# Patient Record
Sex: Female | Born: 1975 | Race: Black or African American | Hispanic: No | Marital: Married | Smoking: Never smoker
Health system: Southern US, Community
[De-identification: ages and names within clinical notes are randomized; demographics above are authoritative.]

## PROBLEM LIST (undated history)

## (undated) DIAGNOSIS — Z9289 Personal history of other medical treatment: Secondary | ICD-10-CM

## (undated) DIAGNOSIS — C50919 Malignant neoplasm of unspecified site of unspecified female breast: Secondary | ICD-10-CM

## (undated) DIAGNOSIS — I8390 Asymptomatic varicose veins of unspecified lower extremity: Secondary | ICD-10-CM

## (undated) DIAGNOSIS — E039 Hypothyroidism, unspecified: Secondary | ICD-10-CM

## (undated) HISTORY — PX: OTHER SURGICAL HISTORY: SHX169

## (undated) HISTORY — PX: VEIN LIGATION AND STRIPPING: SHX2653

## (undated) HISTORY — DX: Personal history of other medical treatment: Z92.89

---

## 2001-05-31 ENCOUNTER — Inpatient Hospital Stay (HOSPITAL_COMMUNITY): Admission: AD | Admit: 2001-05-31 | Discharge: 2001-06-02 | Payer: Self-pay | Admitting: Obstetrics

## 2002-03-30 ENCOUNTER — Encounter: Admission: RE | Admit: 2002-03-30 | Discharge: 2002-03-30 | Payer: Self-pay | Admitting: Neurology

## 2002-06-02 ENCOUNTER — Inpatient Hospital Stay (HOSPITAL_COMMUNITY): Admission: EM | Admit: 2002-06-02 | Discharge: 2002-06-04 | Payer: Self-pay | Admitting: Emergency Medicine

## 2002-06-03 ENCOUNTER — Encounter (INDEPENDENT_AMBULATORY_CARE_PROVIDER_SITE_OTHER): Payer: Self-pay | Admitting: Cardiovascular Disease

## 2002-06-04 ENCOUNTER — Encounter: Payer: Self-pay | Admitting: Cardiovascular Disease

## 2002-06-08 ENCOUNTER — Encounter: Payer: Self-pay | Admitting: Endocrinology

## 2002-06-08 ENCOUNTER — Ambulatory Visit (HOSPITAL_COMMUNITY): Admission: RE | Admit: 2002-06-08 | Discharge: 2002-06-08 | Payer: Self-pay | Admitting: Endocrinology

## 2004-09-26 ENCOUNTER — Encounter: Admission: RE | Admit: 2004-09-26 | Discharge: 2004-11-21 | Payer: Self-pay | Admitting: Orthopedic Surgery

## 2004-11-26 ENCOUNTER — Emergency Department (HOSPITAL_COMMUNITY): Admission: EM | Admit: 2004-11-26 | Discharge: 2004-11-26 | Payer: Self-pay | Admitting: Family Medicine

## 2004-11-28 ENCOUNTER — Emergency Department (HOSPITAL_COMMUNITY): Admission: EM | Admit: 2004-11-28 | Discharge: 2004-11-28 | Payer: Self-pay | Admitting: Family Medicine

## 2006-02-03 ENCOUNTER — Emergency Department (HOSPITAL_COMMUNITY): Admission: EM | Admit: 2006-02-03 | Discharge: 2006-02-03 | Payer: Self-pay | Admitting: Family Medicine

## 2007-06-22 ENCOUNTER — Emergency Department (HOSPITAL_COMMUNITY): Admission: EM | Admit: 2007-06-22 | Discharge: 2007-06-22 | Payer: Self-pay | Admitting: Family Medicine

## 2007-10-10 ENCOUNTER — Emergency Department (HOSPITAL_COMMUNITY): Admission: EM | Admit: 2007-10-10 | Discharge: 2007-10-10 | Payer: Self-pay | Admitting: Family Medicine

## 2008-03-09 ENCOUNTER — Emergency Department (HOSPITAL_COMMUNITY): Admission: EM | Admit: 2008-03-09 | Discharge: 2008-03-09 | Payer: Self-pay | Admitting: Emergency Medicine

## 2008-07-27 ENCOUNTER — Emergency Department (HOSPITAL_COMMUNITY): Admission: EM | Admit: 2008-07-27 | Discharge: 2008-07-27 | Payer: Self-pay | Admitting: Emergency Medicine

## 2010-09-23 ENCOUNTER — Encounter: Payer: Self-pay | Admitting: Orthopedic Surgery

## 2011-01-18 NOTE — Consult Note (Signed)
[name], [name]                            ACCOUNT NO.:  [id number]   MEDICAL RECORD NO.:  [id number]                   PATIENT TYPE:  INP   LOCATION:  3711                                 FACILITY:  [facility]   PHYSICIAN:  Reather Littler, M.D.                    DATE OF BIRTH:  May 14, 1976   DATE OF CONSULTATION:  DATE OF DISCHARGE:                                   CONSULTATION   CHIEF COMPLAINT:  Rapid heart rate.   HISTORY OF PRESENT ILLNESS:  The patient is a 35 year old African-American  patient who presented to the emergency room with rapid heart rate which he  says for about two months he has been noticing intermittent palpitations  with rapid heart rate which he did not seek attention for.  During the  working hours today the patient experienced rather fast heart rate and was  apparently checked at work and found that her heart rate was extremely high.  She was referred to the emergency room and admitted.  The patient also says  that she feels somewhat shaky in her legs and a little bit weaker although  she didn't have any specific fatigue.  She usually does not have any  shakiness of her hands  but she does feel anxious sometimes.  She does not  complain of feeling  excessively hot or sweaty.  She, however, has lost at  least 15 pounds in the last two months.  She thought she was losing weight  intentionally but her weight came off quite rapidly.  The patient also has  had some frequent and somewhat loose bowel movements over this period of  time.  The patient had noticed a little prominence of her legs at this time  also.   Currently the patient does not have any primary care physician and is  admitted under the cardiology service because of sinus tachycardia.   PAST MEDICAL HISTORY:  Not significant.   PERSONAL HISTORY:  The patient works as a Nature conservation officer.   REVIEW OF SYMPTOMS:  No significant illnesses or other conditions.   ALLERGIES:  None.   PHYSICAL  EXAMINATION:  GENERAL:  Patient is adequately built and nourished.  She is alert and cooperative.  She is not unusually restless or  hyperkinetic.  VITAL SIGNS:  Her heart rate is 106, blood pressure normal.  Temperature  normal.  She does not appear to be diaphoretic.  Her hands are not  excessively warm.  She has a mild fine tremor present.  HEENT:  Eyes are not excessively prominent.  There is mild lid lag present  but no stare.  NECK: Patient has a large thyroid, about 2.5 to 3X normal size which has a  mild tremor and bruit.  It is felt to be soft, diffuse without tenderness.  HEART:  Sounds are normal.  LUNGS:  Clear.  ABDOMEN:  No mass.  EXTREMITIES:  She has no edema or skin lesions.  She has extremely brisk  reflexes.    ASSESSMENT:  Patient probably has hyperthyroidism and will need evaluation  and treatment for probable Grave's disease.  Meanwhile she will probably  need high doses of beta blockers to facilitate heart rate control.  We will  schedule an I-131 uptake before the weekend.                                               Reather Littler, M.D.    AK/MEDQ  D:  06/03/2002  T:  06/07/2002  Job:  [id number]

## 2011-05-24 LAB — POCT URINALYSIS DIP (DEVICE)
Bilirubin Urine: NEGATIVE
Glucose, UA: NEGATIVE
Hgb urine dipstick: NEGATIVE
Ketones, ur: NEGATIVE
Nitrite: NEGATIVE
Operator id: 247071
Protein, ur: NEGATIVE
Specific Gravity, Urine: 1.02
Urobilinogen, UA: 0.2
pH: 5.5

## 2011-05-24 LAB — POCT PREGNANCY, URINE
Operator id: 247071
Preg Test, Ur: NEGATIVE

## 2011-05-30 LAB — POCT I-STAT, CHEM 8
BUN: 10
Calcium, Ion: 1.23
Chloride: 104
Creatinine, Ser: 1.2
Glucose, Bld: 93
HCT: 41
Hemoglobin: 13.9
Potassium: 3.8
Sodium: 140
TCO2: 25

## 2011-05-30 LAB — POCT CARDIAC MARKERS
CKMB, poc: 2.7
Myoglobin, poc: 51.2
Operator id: 294591
Troponin i, poc: <0.05

## 2011-05-30 LAB — TSH: TSH: 35.431 — ABNORMAL HIGH (ref 0.350–4.500)

## 2011-06-12 LAB — POCT PREGNANCY, URINE
Operator id: 235561
Preg Test, Ur: NEGATIVE

## 2011-12-22 ENCOUNTER — Encounter (HOSPITAL_COMMUNITY): Payer: Self-pay

## 2011-12-22 ENCOUNTER — Inpatient Hospital Stay (HOSPITAL_COMMUNITY)
Admission: EM | Admit: 2011-12-22 | Discharge: 2011-12-24 | DRG: 392 | Disposition: A | Discharge status: Home / Self Care | Payer: 59 | Attending: Internal Medicine | Admitting: Internal Medicine

## 2011-12-22 ENCOUNTER — Emergency Department (HOSPITAL_COMMUNITY): Payer: 59

## 2011-12-22 DIAGNOSIS — K625 Hemorrhage of anus and rectum: Secondary | ICD-10-CM | POA: Diagnosis present

## 2011-12-22 DIAGNOSIS — Z79899 Other long term (current) drug therapy: Secondary | ICD-10-CM

## 2011-12-22 DIAGNOSIS — K529 Noninfective gastroenteritis and colitis, unspecified: Secondary | ICD-10-CM | POA: Diagnosis present

## 2011-12-22 DIAGNOSIS — E669 Obesity, unspecified: Secondary | ICD-10-CM | POA: Diagnosis present

## 2011-12-22 DIAGNOSIS — E039 Hypothyroidism, unspecified: Secondary | ICD-10-CM | POA: Diagnosis present

## 2011-12-22 DIAGNOSIS — I8390 Asymptomatic varicose veins of unspecified lower extremity: Secondary | ICD-10-CM | POA: Diagnosis present

## 2011-12-22 DIAGNOSIS — R1030 Lower abdominal pain, unspecified: Secondary | ICD-10-CM | POA: Diagnosis present

## 2011-12-22 DIAGNOSIS — K5289 Other specified noninfective gastroenteritis and colitis: Principal | ICD-10-CM | POA: Diagnosis present

## 2011-12-22 DIAGNOSIS — Z6833 Body mass index (BMI) 33.0-33.9, adult: Secondary | ICD-10-CM

## 2011-12-22 DIAGNOSIS — K5900 Constipation, unspecified: Secondary | ICD-10-CM | POA: Diagnosis present

## 2011-12-22 HISTORY — DX: Asymptomatic varicose veins of unspecified lower extremity: I83.90

## 2011-12-22 HISTORY — DX: Hypothyroidism, unspecified: E03.9

## 2011-12-22 LAB — CBC
HCT: 38.7 % (ref 36.0–46.0)
Hemoglobin: 13.2 g/dL (ref 12.0–15.0)
MCH: 30.4 pg (ref 26.0–34.0)
MCHC: 34.1 g/dL (ref 30.0–36.0)
MCV: 89.2 fL (ref 78.0–100.0)
Platelets: 265 10*3/uL (ref 150–400)
RBC: 4.34 MIL/uL (ref 3.87–5.11)
RDW: 13.1 % (ref 11.5–15.5)
WBC: 10.3 10*3/uL (ref 4.0–10.5)

## 2011-12-22 LAB — DIFFERENTIAL
Basophils Absolute: 0 10*3/uL (ref 0.0–0.1)
Basophils Relative: 0 % (ref 0–1)
Eosinophils Absolute: 0 10*3/uL (ref 0.0–0.7)
Eosinophils Relative: 0 % (ref 0–5)
Lymphocytes Relative: 31 % (ref 12–46)
Lymphs Abs: 3.2 10*3/uL (ref 0.7–4.0)
Monocytes Absolute: 0.6 10*3/uL (ref 0.1–1.0)
Monocytes Relative: 6 % (ref 3–12)
Neutro Abs: 6.5 10*3/uL (ref 1.7–7.7)
Neutrophils Relative %: 63 % (ref 43–77)

## 2011-12-22 LAB — COMPREHENSIVE METABOLIC PANEL
ALT: 13 U/L (ref 0–35)
AST: 21 U/L (ref 0–37)
Albumin: 4.3 g/dL (ref 3.5–5.2)
Alkaline Phosphatase: 55 U/L (ref 39–117)
BUN: 5 mg/dL — ABNORMAL LOW (ref 6–23)
CO2: 25 mEq/L (ref 19–32)
Calcium: 10.2 mg/dL (ref 8.4–10.5)
Chloride: 103 mEq/L (ref 96–112)
Creatinine, Ser: 1.08 mg/dL (ref 0.50–1.10)
GFR calc Af Amer: 76 mL/min — ABNORMAL LOW (ref >90)
GFR calc non Af Amer: 65 mL/min — ABNORMAL LOW (ref >90)
Glucose, Bld: 107 mg/dL — ABNORMAL HIGH (ref 70–99)
Potassium: 3.6 mEq/L (ref 3.5–5.1)
Sodium: 137 mEq/L (ref 135–145)
Total Bilirubin: 0.4 mg/dL (ref 0.3–1.2)
Total Protein: 8.1 g/dL (ref 6.0–8.3)

## 2011-12-22 LAB — PROTIME-INR
INR: 1 (ref 0.00–1.49)
Prothrombin Time: 13.4 seconds (ref 11.6–15.2)

## 2011-12-22 LAB — HEMOGLOBIN AND HEMATOCRIT, BLOOD
HCT: 36.4 % (ref 36.0–46.0)
Hemoglobin: 12.4 g/dL (ref 12.0–15.0)

## 2011-12-22 LAB — SAMPLE TO BLOOD BANK

## 2011-12-22 LAB — APTT: aPTT: 31 seconds (ref 24–37)

## 2011-12-22 MED ORDER — METRONIDAZOLE IN NACL 5-0.79 MG/ML-% IV SOLN
500.0000 mg | Freq: Once | INTRAVENOUS | Status: AC
  Administered 2011-12-22: 500 mg via INTRAVENOUS
  Filled 2011-12-22: qty 100

## 2011-12-22 MED ORDER — SODIUM CHLORIDE 0.9 % IV SOLN
Freq: Once | INTRAVENOUS | Status: AC
  Administered 2011-12-22: 20:00:00 via INTRAVENOUS

## 2011-12-22 MED ORDER — ONDANSETRON HCL 4 MG/2ML IJ SOLN
4.0000 mg | Freq: Four times a day (QID) | INTRAMUSCULAR | Status: DC | PRN
  Administered 2011-12-23: 4 mg via INTRAVENOUS
  Filled 2011-12-22: qty 2

## 2011-12-22 MED ORDER — SODIUM CHLORIDE 0.9 % IV SOLN
INTRAVENOUS | Status: DC
  Administered 2011-12-22 – 2011-12-23 (×2): via INTRAVENOUS
  Administered 2011-12-23: 100 mL/h via INTRAVENOUS

## 2011-12-22 MED ORDER — SODIUM CHLORIDE 0.9 % IJ SOLN
3.0000 mL | Freq: Two times a day (BID) | INTRAMUSCULAR | Status: DC
  Administered 2011-12-24: 3 mL via INTRAVENOUS

## 2011-12-22 MED ORDER — ACETAMINOPHEN 650 MG RE SUPP: 650.0000 mg | Freq: Four times a day (QID) | RECTAL | Status: DC | PRN

## 2011-12-22 MED ORDER — IOHEXOL 300 MG/ML  SOLN: 100.0000 mL | Freq: Once | INTRAMUSCULAR | Status: AC | PRN

## 2011-12-22 MED ORDER — ZOLPIDEM TARTRATE 5 MG PO TABS: 5.0000 mg | ORAL_TABLET | Freq: Every evening | ORAL | Status: DC | PRN

## 2011-12-22 MED ORDER — ACETAMINOPHEN 325 MG PO TABS: 650.0000 mg | ORAL_TABLET | Freq: Four times a day (QID) | ORAL | Status: DC | PRN

## 2011-12-22 MED ORDER — PANTOPRAZOLE SODIUM 40 MG IV SOLR
40.0000 mg | Freq: Two times a day (BID) | INTRAVENOUS | Status: DC
  Administered 2011-12-22 – 2011-12-24 (×4): 40 mg via INTRAVENOUS
  Filled 2011-12-22 (×7): qty 40

## 2011-12-22 MED ORDER — HYDROMORPHONE HCL PF 1 MG/ML IJ SOLN
0.5000 mg | INTRAMUSCULAR | Status: DC | PRN
  Administered 2011-12-23: 1 mg via INTRAVENOUS
  Filled 2011-12-22: qty 1

## 2011-12-22 MED ORDER — CIPROFLOXACIN IN D5W 400 MG/200ML IV SOLN
400.0000 mg | Freq: Once | INTRAVENOUS | Status: AC
  Administered 2011-12-22: 400 mg via INTRAVENOUS
  Filled 2011-12-22: qty 200

## 2011-12-22 MED ORDER — OXYCODONE HCL 5 MG PO TABS: 5.0000 mg | ORAL_TABLET | ORAL | Status: DC | PRN

## 2011-12-22 MED ORDER — ONDANSETRON HCL 4 MG PO TABS: 4.0000 mg | ORAL_TABLET | Freq: Four times a day (QID) | ORAL | Status: DC | PRN

## 2011-12-22 NOTE — ED Notes (Signed)
RN to obtain labs when placing IV

## 2011-12-22 NOTE — ED Notes (Signed)
Pt in from home with c/o rectal bleeding states onset last night after taking stool softener states stool was bright red

## 2011-12-22 NOTE — ED Notes (Signed)
Patient transported to CT 

## 2011-12-22 NOTE — ED Provider Notes (Signed)
History     CSN: [id number]  Arrival date & time 12/22/11  1759   First MD Initiated Contact with Patient 12/22/11 1935      Chief Complaint  Patient presents with  . Rectal Bleeding    (Consider location/radiation/quality/duration/timing/severity/associated sxs/prior treatment) Patient is a 36 y.o. female presenting with hematochezia. The history is provided by the patient.  Rectal Bleeding    patient here with bright red blood per rectum x2 days. Does note some lower abdominal cramping. Has had constipation recently with hard stools and symptoms started after she tried to move her bowels. No vomiting or fever. No prior history of inflammatory bowel disease or rectal bleeding or colon masses according to the patient. No medications used prior to arrival. Symptoms worse with defecation and better with nothing  Past Medical History  Diagnosis Date  . Thyroid disease     History reviewed. No pertinent past surgical history.  No family history on file.  History  Substance Use Topics  . Smoking status: Never Smoker   . Smokeless tobacco: Not on file  . Alcohol Use: No    OB History    Grav Para Term Preterm Abortions TAB SAB Ect Mult Living                  Review of Systems  Gastrointestinal: Positive for hematochezia.  All other systems reviewed and are negative.    Allergies  Vicodin  Home Medications   Current Outpatient Rx  Name Route Sig Dispense Refill  . LEVOTHYROXINE SODIUM 200 MCG PO TABS Oral Take 200 mcg by mouth daily.      BP 153/87  Pulse 92  Temp(Src) 99 F (37.2 C) (Oral)  Resp 16  SpO2 100%  LMP 12/10/2011  Physical Exam  Nursing note and vitals reviewed. Constitutional: She is oriented to person, place, and time. She appears well-developed and well-nourished.  Non-toxic appearance. No distress.  HENT:  Head: Normocephalic and atraumatic.  Eyes: Conjunctivae, EOM and lids are normal. Pupils are equal, round, and reactive to light.    Neck: Normal range of motion. Neck supple. No tracheal deviation present. No mass present.  Cardiovascular: Normal rate, regular rhythm and normal heart sounds.  Exam reveals no gallop.   No murmur heard. Pulmonary/Chest: Effort normal and breath sounds normal. No stridor. No respiratory distress. She has no decreased breath sounds. She has no wheezes. She has no rhonchi. She has no rales.  Abdominal: Soft. Normal appearance and bowel sounds are normal. She exhibits no distension. There is no tenderness. There is no rebound and no CVA tenderness.  Genitourinary:       Pink changed mucus noted on rectal exam, no external hemorrhoids  Musculoskeletal: Normal range of motion. She exhibits no edema and no tenderness.  Neurological: She is alert and oriented to person, place, and time. She has normal strength. No cranial nerve deficit or sensory deficit. GCS eye subscore is 4. GCS verbal subscore is 5. GCS motor subscore is 6.  Skin: Skin is warm and dry. No abrasion and no rash noted.  Psychiatric: She has a normal mood and affect. Her speech is normal and behavior is normal.    ED Course  Procedures (including critical care time)   Labs Reviewed  CBC  DIFFERENTIAL  COMPREHENSIVE METABOLIC PANEL  PROTIME-INR  APTT  SAMPLE TO BLOOD BANK   No results found.   No diagnosis found.    MDM  Pt with colitis as seen on abd ct,  abx started, will be admitted to triad        Toy Baker, MD 12/27/11 [phone]

## 2011-12-22 NOTE — ED Notes (Signed)
MD at bedside. 

## 2011-12-22 NOTE — ED Notes (Signed)
CT notified pt completed CM 

## 2011-12-23 ENCOUNTER — Encounter (HOSPITAL_COMMUNITY): Payer: Self-pay | Admitting: Internal Medicine

## 2011-12-23 DIAGNOSIS — K529 Noninfective gastroenteritis and colitis, unspecified: Secondary | ICD-10-CM | POA: Diagnosis present

## 2011-12-23 DIAGNOSIS — E039 Hypothyroidism, unspecified: Secondary | ICD-10-CM | POA: Insufficient documentation

## 2011-12-23 DIAGNOSIS — R1030 Lower abdominal pain, unspecified: Secondary | ICD-10-CM | POA: Diagnosis present

## 2011-12-23 DIAGNOSIS — K625 Hemorrhage of anus and rectum: Secondary | ICD-10-CM | POA: Diagnosis present

## 2011-12-23 DIAGNOSIS — I8390 Asymptomatic varicose veins of unspecified lower extremity: Secondary | ICD-10-CM | POA: Insufficient documentation

## 2011-12-23 LAB — BASIC METABOLIC PANEL
BUN: 5 mg/dL — ABNORMAL LOW (ref 6–23)
CO2: 25 mEq/L (ref 19–32)
Calcium: 9 mg/dL (ref 8.4–10.5)
Chloride: 107 mEq/L (ref 96–112)
Creatinine, Ser: 1.03 mg/dL (ref 0.50–1.10)
GFR calc Af Amer: 80 mL/min — ABNORMAL LOW (ref >90)
GFR calc non Af Amer: 69 mL/min — ABNORMAL LOW (ref >90)
Glucose, Bld: 107 mg/dL — ABNORMAL HIGH (ref 70–99)
Potassium: 3.7 mEq/L (ref 3.5–5.1)
Sodium: 139 mEq/L (ref 135–145)

## 2011-12-23 LAB — CBC
HCT: 35.1 % — ABNORMAL LOW (ref 36.0–46.0)
Hemoglobin: 11.7 g/dL — ABNORMAL LOW (ref 12.0–15.0)
MCH: 29.7 pg (ref 26.0–34.0)
MCHC: 33.3 g/dL (ref 30.0–36.0)
MCV: 89.1 fL (ref 78.0–100.0)
Platelets: 214 10*3/uL (ref 150–400)
RBC: 3.94 MIL/uL (ref 3.87–5.11)
RDW: 13.1 % (ref 11.5–15.5)
WBC: 8.4 10*3/uL (ref 4.0–10.5)

## 2011-12-23 LAB — TYPE AND SCREEN
ABO/RH(D): A POS
Antibody Screen: NEGATIVE

## 2011-12-23 LAB — ABO/RH: ABO/RH(D): A POS

## 2011-12-23 MED ORDER — ALUM & MAG HYDROXIDE-SIMETH 200-200-20 MG/5ML PO SUSP
30.0000 mL | Freq: Four times a day (QID) | ORAL | Status: DC | PRN
  Administered 2011-12-23: 30 mL via ORAL
  Filled 2011-12-23: qty 30

## 2011-12-23 MED ORDER — METRONIDAZOLE IN NACL 5-0.79 MG/ML-% IV SOLN
500.0000 mg | Freq: Three times a day (TID) | INTRAVENOUS | Status: DC
  Administered 2011-12-23 – 2011-12-24 (×3): 500 mg via INTRAVENOUS
  Filled 2011-12-23 (×7): qty 100

## 2011-12-23 MED ORDER — CIPROFLOXACIN IN D5W 400 MG/200ML IV SOLN
400.0000 mg | INTRAVENOUS | Status: DC
  Administered 2011-12-23: 400 mg via INTRAVENOUS
  Filled 2011-12-23 (×2): qty 200

## 2011-12-23 MED ORDER — SODIUM CHLORIDE 0.9 % IV SOLN
INTRAVENOUS | Status: AC
  Administered 2011-12-23: 02:00:00 via INTRAVENOUS

## 2011-12-23 MED ORDER — HYDROMORPHONE HCL PF 1 MG/ML IJ SOLN: 0.5000 mg | INTRAMUSCULAR | Status: DC | PRN

## 2011-12-23 NOTE — Progress Notes (Signed)
Subjective: Ambulating in room. Gait steady. No further BRBPR since 1am. NAD.  Objective: Vital signs Filed Vitals:   12/22/11 1811 12/23/11 0054 12/23/11 0115 12/23/11 0516  BP: 153/87 114/58 104/60 105/63  Pulse: 92 63 64 63  Temp: 99 F (37.2 C) 98.5 F (36.9 C) 98.7 F (37.1 C) 98 F (36.7 C)  TempSrc: Oral Oral Oral Oral  Resp: 16 18 18 19   Height:    5\' 4"  (1.626 m)  Weight:    88.633 kg (195 lb 6.4 oz)  SpO2: 100% 99% 97% 99%   Weight change:  Last BM Date: 12/22/11 (Pt reports prior bm's were bloody w/bright red blood)  Intake/Output from previous day: 04/21 0701 - 04/22 0700 In: 600 [I.V.:600] Out: 250 [Urine:250] Total I/O In: 240 [P.O.:240] Out: 400 [Urine:400]   Physical Exam: General: Alert, awake, oriented x3, in no acute distress. Well nourished, very pleasant HEENT: No bruits, no goiter. Mucus membranes mouth moist/pink. PERRL Heart: Regular rate and rhythm, without murmurs, rubs, gallops. No LEE Lungs:Normal effort. Breath sounds clear to auscultation bilaterally. No wheeze, rhonchi Abdomen: Obese,  Soft, nontender, nondistended, positive bowel sounds but very sluggish. Extremities: No clubbing cyanosis or edema with positive pedal pulses. Neuro: Grossly intact, nonfocal. Cranial nerve II-XII intact.     Lab Results: Basic Metabolic Panel:  Basename 12/23/11 0410 12/22/11 1948  NA 139 137  K 3.7 3.6  CL 107 103  CO2 25 25  GLUCOSE 107* 107*  BUN 5* 5*  CREATININE 1.03 1.08  CALCIUM 9.0 10.2  MG -- --  PHOS -- --   Liver Function Tests:  [facility] 12/22/11 1948  AST 21  ALT 13  ALKPHOS 55  BILITOT 0.4  PROT 8.1  ALBUMIN 4.3   No results found for this basename: LIPASE:2,AMYLASE:2 in the last 72 hours No results found for this basename: AMMONIA:2 in the last 72 hours CBC:  Basename 12/23/11 0410 12/22/11 2250 12/22/11 1948  WBC 8.4 -- 10.3  NEUTROABS -- -- 6.5  HGB 11.7* 12.4 --  HCT 35.1* 36.4 --  MCV 89.1 -- 89.2  PLT 214  -- 265   Cardiac Enzymes: No results found for this basename: CKTOTAL:3,CKMB:3,CKMBINDEX:3,TROPONINI:3 in the last 72 hours BNP: No results found for this basename: PROBNP:3 in the last 72 hours D-Dimer: No results found for this basename: DDIMER:2 in the last 72 hours CBG: No results found for this basename: GLUCAP:6 in the last 72 hours Hemoglobin A1C: No results found for this basename: HGBA1C in the last 72 hours Fasting Lipid Panel: No results found for this basename: CHOL,HDL,LDLCALC,TRIG,CHOLHDL,LDLDIRECT in the last 72 hours Thyroid Function Tests: No results found for this basename: TSH,T4TOTAL,FREET4,T3FREE,THYROIDAB in the last 72 hours Anemia Panel: No results found for this basename: VITAMINB12,FOLATE,FERRITIN,TIBC,IRON,RETICCTPCT in the last 72 hours Coagulation:  Basename 12/22/11 1948  LABPROT 13.4  INR 1.00   Urine Drug Screen: Drugs of Abuse  No results found for this basename: labopia, cocainscrnur, labbenz, amphetmu, thcu, labbarb    Alcohol Level: No results found for this basename: ETH:2 in the last 72 hours Urinalysis: No results found for this basename: COLORURINE:2,APPERANCEUR:2,LABSPEC:2,PHURINE:2,GLUCOSEU:2,HGBUR:2,BILIRUBINUR:2,KETONESUR:2,PROTEINUR:2,UROBILINOGEN:2,NITRITE:2,LEUKOCYTESUR:2 in the last 72 hours Misc. Labs:  No results found for this or any previous visit (from the past 240 hour(s)).  Studies/Results: Ct Abdomen Pelvis W Contrast  12/22/2011  *RADIOLOGY REPORT*  Clinical Data: Bright red blood per rectum for 2 days.  Lower abdominal cramping and constipation.  CT ABDOMEN AND PELVIS WITH CONTRAST  Technique:  Multidetector CT imaging of  the abdomen and pelvis was performed following the standard protocol during bolus administration of intravenous contrast.  Contrast:  100 ml Omnipaque-300  Comparison: None.  Findings: Focal atelectasis or infiltration in the left lung base medially.  Small oral contrast collection in the abdominal  hiatus which could represent a small hiatal hernia or a lower esophageal diverticulum.  The liver, spleen, gallbladder, pancreas, adrenal glands, kidneys, abdominal aorta, and retroperitoneal lymph nodes are unremarkable. The gastric wall is not thickened.  Small bowel are not distended. No obvious wall thickening.  No free air or free fluid in the abdomen.  Pelvis:  The distal transverse colon, descending colon, and rectosigmoid colon are decompressed.  There is suggestion of diffuse mural edema.  Infectious colitis or inflammatory bowel disease should be excluded although changes may just be due to under distension.  Stool filled ascending colon without wall thickening.  The uterus and adnexal structures are not enlarged.  Minimal free fluid in the pelvis, likely physiologic.  Bladder wall is not thickened.  No loculated pelvic fluid collections.  No significant pelvic lymphadenopathy.  Normal alignment of the lumbar vertebrae.  IMPRESSION: Diffuse wall thickening and edema is suggested in the left colon, although changes may be due to under distension.  Colitis should be excluded.  No evidence of obstruction.  Small esophageal hiatal hernia versus lower esophageal diverticulum.  Minimal free fluid in the pelvis.  Original Report Authenticated By: Marlon Pel, M.D.    Medications: Scheduled Meds:   . sodium chloride   Intravenous Once  . sodium chloride   Intravenous STAT  . ciprofloxacin  400 mg Intravenous Once  . metronidazole  500 mg Intravenous Once  . pantoprazole (PROTONIX) IV  40 mg Intravenous Q12H  . sodium chloride  3 mL Intravenous Q12H   Continuous Infusions:   . sodium chloride 100 mL/hr (12/23/11 0812)   PRN Meds:.acetaminophen, acetaminophen, HYDROmorphone, iohexol, ondansetron (ZOFRAN) IV, ondansetron, oxyCODONE, zolpidem  Assessment/Plan:  Principal Problem:  *Rectal bleeding: No further episodes since 1am. Hg stable. Will discontinue serial H&H. Will check in am  and/or if has another episode BRBPR. VSS. Continue IV protonix. Will defer GI consult for now given improvement. Will need OP follow up. If reoccurrence will consult GI. Active Problems:  Abdominal pain, lower: much improved. Continue to manage with IV dilaudid. CT as above. No BM/flatus. Monitor. Continue protonix  Colitis, acute: no hx of same. CT abdomen as above. Requesting advancement of diet. Will advance diet. Have explained to pt. Need to rest bowel . Reports some appetite and want to try to advance. Will monitor.  Dispo: home when medically stable. Hopefully 24-48 hours.     LOS: 1 day   [facility] M 12/23/2011, 1:05 PM

## 2011-12-23 NOTE — H&P (Signed)
DATE OF ADMISSION:  12/23/2011  PCP:    Provider Not In System   Chief Complaint: Rectal Bleeding   HPI: [name] is an 36 y.o. female who presents to the Ed with complaints of rectal bleeding which started in the afternoon.  She reports seeing copious bright red blood in the toilet. She reports having constipation the day before, and taking 2 stool softeners the next Am.  And in the afternoon her symptoms started.   She denies any lightheadedness, weakness, fatigue SOB or Chest pain.  She also denies having nausea or vomiting or fever.   She does report having crampy lower abdominal pain.  She states that she has not had any similar symptoms in the past.  In the Ed she was found to have hematochezia, and was Heme+, her admission hemoglobin was 13.2, and she was hemodynamically stable.    Past Medical History  Diagnosis Date  . Hypothyroid   . Varicose veins     Past Surgical History  Procedure Date  . Vein ligation and stripping     Medications:  HOME MEDS: Prior to Admission medications   Medication Sig Start Date End Date Taking? Authorizing Provider  levothyroxine (SYNTHROID, LEVOTHROID) 200 MCG tablet Take 200 mcg by mouth daily.   Yes Historical Provider, MD    Allergies:  Allergies  Allergen Reactions  . Vicodin (Hydrocodone-Acetaminophen) Nausea And Vomiting    Social History:   reports that she has never smoked. She does not have any smokeless tobacco history on file. She reports that she does not drink alcohol or use illicit drugs.  Family History: Family History  Problem Relation Age of Onset  . Diabetes type I Father   . Diabetes type II Sister   . Hypertension Sister     Review of Systems: Positive for:  abnormal bleeding, abdominal pain, The patient denies anorexia, fever, weight loss, vision loss, decreased hearing, hoarseness, chest pain, syncope, dyspnea on exertion, peripheral edema, balance deficits, hemoptysis,  melena, hematochezia, severe  indigestion/heartburn, hematuria, incontinence, genital sores, muscle weakness, suspicious skin lesions, transient blindness, difficulty walking, depression, unusual weight change, , enlarged lymph nodes, angioedema, and breast masses.   Physical Exam:  GEN:  Pleasant 36 year old well nourished and well developed African American female examined  and in no acute distress; cooperative with exam Filed Vitals:   12/22/11 1811 12/23/11 0054  BP: 153/87 114/58  Pulse: 92 63  Temp: 99 F (37.2 C) 98.5 F (36.9 C)  TempSrc: Oral Oral  Resp: 16 18  SpO2: 100% 99%   Blood pressure 114/58, pulse 63, temperature 98.5 F (36.9 C), temperature source Oral, resp. rate 18, last menstrual period 12/10/2011, SpO2 99.00%. PSYCH: She is alert and oriented x4; does not appear anxious does not appear depressed; affect is normal HEENT: Normocephalic and Atraumatic, Mucous membranes pink; PERRLA; EOM intact; Fundi:  Benign;  No scleral icterus, Nares: Patent, Oropharynx: Clear, Fair Dentition, Neck:  FROM, no cervical lymphadenopathy nor thyromegaly or carotid bruit; no JVD; Breasts:: Not examined CHEST WALL: No tenderness CHEST: Normal respiration, clear to auscultation bilaterally HEART: Regular rate and rhythm; no murmurs rubs or gallops BACK: No kyphosis or scoliosis; no CVA tenderness ABDOMEN: Positive Bowel Sounds, Obese, soft non-tender; no masses, no organomegaly. Rectal Exam:  Performed by EDP EXTREMITIES: No cyanosis, clubbing or edema; no ulcerations. Genitalia: not examined PULSES: 2+ and symmetric SKIN: Normal hydration no rash or ulceration CNS: Cranial nerves 2-12 grossly intact no focal neurologic deficit  Labs & Imaging Results for orders placed during the hospital encounter of 12/22/11 (from the past 48 hour(s))  CBC     Status: Normal   Collection Time   12/22/11  7:48 PM      Component Value Range Comment   WBC 10.3  4.0 - 10.5 (K/uL)    RBC 4.34  3.87 - 5.11 (MIL/uL)     Hemoglobin 13.2  12.0 - 15.0 (g/dL)    HCT 19.1  47.8 - 29.5 (%)    MCV 89.2  78.0 - 100.0 (fL)    MCH 30.4  26.0 - 34.0 (pg)    MCHC 34.1  30.0 - 36.0 (g/dL)    RDW 62.1  30.8 - 65.7 (%)    Platelets 265  150 - 400 (K/uL)   DIFFERENTIAL     Status: Normal   Collection Time   12/22/11  7:48 PM      Component Value Range Comment   Neutrophils Relative 63  43 - 77 (%)    Neutro Abs 6.5  1.7 - 7.7 (K/uL)    Lymphocytes Relative 31  12 - 46 (%)    Lymphs Abs 3.2  0.7 - 4.0 (K/uL)    Monocytes Relative 6  3 - 12 (%)    Monocytes Absolute 0.6  0.1 - 1.0 (K/uL)    Eosinophils Relative 0  0 - 5 (%)    Eosinophils Absolute 0.0  0.0 - 0.7 (K/uL)    Basophils Relative 0  0 - 1 (%)    Basophils Absolute 0.0  0.0 - 0.1 (K/uL)   COMPREHENSIVE METABOLIC PANEL     Status: Abnormal   Collection Time   12/22/11  7:48 PM      Component Value Range Comment   Sodium 137  135 - 145 (mEq/L)    Potassium 3.6  3.5 - 5.1 (mEq/L)    Chloride 103  96 - 112 (mEq/L)    CO2 25  19 - 32 (mEq/L)    Glucose, Bld 107 (*) 70 - 99 (mg/dL)    BUN 5 (*) 6 - 23 (mg/dL)    Creatinine, Ser 8.46  0.50 - 1.10 (mg/dL)    Calcium 96.2  8.4 - 10.5 (mg/dL)    Total Protein 8.1  6.0 - 8.3 (g/dL)    Albumin 4.3  3.5 - 5.2 (g/dL)    AST 21  0 - 37 (U/L)    ALT 13  0 - 35 (U/L)    Alkaline Phosphatase 55  39 - 117 (U/L)    Total Bilirubin 0.4  0.3 - 1.2 (mg/dL)    GFR calc non Af Amer 65 (*) >90 (mL/min)    GFR calc Af Amer 76 (*) >90 (mL/min)   PROTIME-INR     Status: Normal   Collection Time   12/22/11  7:48 PM      Component Value Range Comment   Prothrombin Time 13.4  11.6 - 15.2 (seconds)    INR 1.00  0.00 - 1.49    APTT     Status: Normal   Collection Time   12/22/11  7:48 PM      Component Value Range Comment   aPTT 31  24 - 37 (seconds)   SAMPLE TO BLOOD BANK     Status: Normal   Collection Time   12/22/11  7:48 PM      Component Value Range Comment   Blood Bank Specimen SAMPLE AVAILABLE FOR TESTING  Sample Expiration 12/25/2011     HEMOGLOBIN AND HEMATOCRIT, BLOOD     Status: Normal   Collection Time   12/22/11 10:50 PM      Component Value Range Comment   Hemoglobin 12.4  12.0 - 15.0 (g/dL)    HCT 14.7  82.9 - 56.2 (%)   TYPE AND SCREEN     Status: Normal   Collection Time   12/22/11 10:50 PM      Component Value Range Comment   ABO/RH(D) A POS      Antibody Screen NEG      Sample Expiration 12/25/2011     ABO/RH     Status: Normal   Collection Time   12/22/11 10:50 PM      Component Value Range Comment   ABO/RH(D) A POS      Ct Abdomen Pelvis W Contrast  12/22/2011  *RADIOLOGY REPORT*  Clinical Data: Bright red blood per rectum for 2 days.  Lower abdominal cramping and constipation.  CT ABDOMEN AND PELVIS WITH CONTRAST  Technique:  Multidetector CT imaging of the abdomen and pelvis was performed following the standard protocol during bolus administration of intravenous contrast.  Contrast:  100 ml Omnipaque-300  Comparison: None.  Findings: Focal atelectasis or infiltration in the left lung base medially.  Small oral contrast collection in the abdominal hiatus which could represent a small hiatal hernia or a lower esophageal diverticulum.  The liver, spleen, gallbladder, pancreas, adrenal glands, kidneys, abdominal aorta, and retroperitoneal lymph nodes are unremarkable. The gastric wall is not thickened.  Small bowel are not distended. No obvious wall thickening.  No free air or free fluid in the abdomen.  Pelvis:  The distal transverse colon, descending colon, and rectosigmoid colon are decompressed.  There is suggestion of diffuse mural edema.  Infectious colitis or inflammatory bowel disease should be excluded although changes may just be due to under distension.  Stool filled ascending colon without wall thickening.  The uterus and adnexal structures are not enlarged.  Minimal free fluid in the pelvis, likely physiologic.  Bladder wall is not thickened.  No loculated pelvic fluid  collections.  No significant pelvic lymphadenopathy.  Normal alignment of the lumbar vertebrae.  IMPRESSION: Diffuse wall thickening and edema is suggested in the left colon, although changes may be due to under distension.  Colitis should be excluded.  No evidence of obstruction.  Small esophageal hiatal hernia versus lower esophageal diverticulum.  Minimal free fluid in the pelvis.  Original Report Authenticated By: Marlon Pel, M.D.      Assessment: Present on Admission:  .Rectal bleeding .Abdominal pain, lower .Colitis, acute    Plan:        Admit to Telemetry Bed IVFs and IV Protonix ordered IV Cipro and Metronidazole antibiotic therapy ordered for colitis Pain Control PRN Type and screen sent and H/H checks, transfuse if needed SCDs  Consider GI consultation.   Other plans as per orders.    CODE STATUS:      FULL CODE      Shalayah Beagley C 12/23/2011, 12:56 AM

## 2011-12-24 LAB — CBC
HCT: 36.9 % (ref 36.0–46.0)
Hemoglobin: 12.3 g/dL (ref 12.0–15.0)
MCH: 30.1 pg (ref 26.0–34.0)
MCHC: 33.3 g/dL (ref 30.0–36.0)
MCV: 90.2 fL (ref 78.0–100.0)
Platelets: 227 10*3/uL (ref 150–400)
RBC: 4.09 MIL/uL (ref 3.87–5.11)
RDW: 13.3 % (ref 11.5–15.5)
WBC: 7.7 10*3/uL (ref 4.0–10.5)

## 2011-12-24 MED ORDER — UNABLE TO FIND: Status: DC

## 2011-12-24 MED ORDER — METRONIDAZOLE 500 MG PO TABS: 500.0000 mg | ORAL_TABLET | Freq: Three times a day (TID) | ORAL | Status: AC

## 2011-12-24 MED ORDER — CIPROFLOXACIN HCL 500 MG PO TABS: 250.0000 mg | ORAL_TABLET | Freq: Two times a day (BID) | ORAL | Status: AC

## 2011-12-24 MED ORDER — OXYCODONE HCL 5 MG PO TABS: 5.0000 mg | ORAL_TABLET | ORAL | Status: AC | PRN

## 2011-12-24 NOTE — Discharge Summary (Signed)
Physician Discharge Summary  Patient ID: [name] MRN: [id number] DOB/AGE: 37-Mar-1977 37 y.o.  Admit date: 12/22/2011 Discharge date: 12/24/2011  Primary Care Physician:  Provider Not In System   Discharge Diagnoses:    Principal Problem:  *Rectal bleeding Active Problems:  Abdominal pain, lower  Colitis, acute   Medication List  As of 12/24/2011  1:49 PM   TAKE these medications         ciprofloxacin 500 MG tablet   Commonly known as: CIPRO   Take 0.5 tablets (250 mg total) by mouth 2 (two) times daily.      levothyroxine 200 MCG tablet   Commonly known as: SYNTHROID, LEVOTHROID   Take 200 mcg by mouth daily.      metroNIDAZOLE 500 MG tablet   Commonly known as: FLAGYL   Take 1 tablet (500 mg total) by mouth 3 (three) times daily.      oxyCODONE 5 MG immediate release tablet   Commonly known as: Oxy IR/ROXICODONE   Take 1 tablet (5 mg total) by mouth every 4 (four) hours as needed.      UNABLE TO FIND   Kimmberly may return to work 12/26/11 with no restrictions             Disposition and Follow-up: Pt is medically stable and ready for discharge. Will follow up with OB/Gyn as this is only provider she has and will request referral for GI OP follow up and/or PCP referral.   Consults:  None   Physical Exam: General: Alert, awake, oriented x3, in no acute distress. Well nourished, very pleasant  HEENT: No bruits, no goiter. Mucus membranes mouth moist/pink. PERRL  Heart: Regular rate and rhythm, without murmurs, rubs, gallops. No LEE  Lungs:Normal effort. Breath sounds clear to auscultation bilaterally. No wheeze, rhonchi  Abdomen: Obese, Soft, nontender, nondistended, positive bowel sounds .  Extremities: No clubbing cyanosis or edema with positive pedal pulses.  Neuro: Grossly intact, nonfocal. Cranial nerve II-XII intact    Significant Diagnostic Studies:  Ct Abdomen Pelvis W Contrast  12/22/2011  *RADIOLOGY REPORT*  Clinical Data: Bright red blood per  rectum for 2 days.  Lower abdominal cramping and constipation.  CT ABDOMEN AND PELVIS WITH CONTRAST  Technique:  Multidetector CT imaging of the abdomen and pelvis was performed following the standard protocol during bolus administration of intravenous contrast.  Contrast:  100 ml Omnipaque-300  Comparison: None.  Findings: Focal atelectasis or infiltration in the left lung base medially.  Small oral contrast collection in the abdominal hiatus which could represent a small hiatal hernia or a lower esophageal diverticulum.  The liver, spleen, gallbladder, pancreas, adrenal glands, kidneys, abdominal aorta, and retroperitoneal lymph nodes are unremarkable. The gastric wall is not thickened.  Small bowel are not distended. No obvious wall thickening.  No free air or free fluid in the abdomen.  Pelvis:  The distal transverse colon, descending colon, and rectosigmoid colon are decompressed.  There is suggestion of diffuse mural edema.  Infectious colitis or inflammatory bowel disease should be excluded although changes may just be due to under distension.  Stool filled ascending colon without wall thickening.  The uterus and adnexal structures are not enlarged.  Minimal free fluid in the pelvis, likely physiologic.  Bladder wall is not thickened.  No loculated pelvic fluid collections.  No significant pelvic lymphadenopathy.  Normal alignment of the lumbar vertebrae.  IMPRESSION: Diffuse wall thickening and edema is suggested in the left colon, although changes may be due to  under distension.  Colitis should be excluded.  No evidence of obstruction.  Small esophageal hiatal hernia versus lower esophageal diverticulum.  Minimal free fluid in the pelvis.  Original Report Authenticated By: Marlon Pel, M.D.    Labs Reviewed  COMPREHENSIVE METABOLIC PANEL - Abnormal; Notable for the following:    Glucose, Bld 107 (*)    BUN 5 (*)    GFR calc non Af Amer 65 (*)    GFR calc Af Amer 76 (*)    All other  components within normal limits  BASIC METABOLIC PANEL - Abnormal; Notable for the following:    Glucose, Bld 107 (*)    BUN 5 (*)    GFR calc non Af Amer 69 (*)    GFR calc Af Amer 80 (*)    All other components within normal limits  CBC - Abnormal; Notable for the following:    Hemoglobin 11.7 (*)    HCT 35.1 (*)    All other components within normal limits  CBC  DIFFERENTIAL  PROTIME-INR  APTT  SAMPLE TO BLOOD BANK  HEMOGLOBIN AND HEMATOCRIT, BLOOD  TYPE AND SCREEN  ABO/RH  CBC        Ct Abdomen Pelvis W Contrast  12/22/2011  *RADIOLOGY REPORT*  Clinical Data: Bright red blood per rectum for 2 days.  Lower abdominal cramping and constipation.  CT ABDOMEN AND PELVIS WITH CONTRAST  Technique:  Multidetector CT imaging of the abdomen and pelvis was performed following the standard protocol during bolus administration of intravenous contrast.  Contrast:  100 ml Omnipaque-300  Comparison: None.  Findings: Focal atelectasis or infiltration in the left lung base medially.  Small oral contrast collection in the abdominal hiatus which could represent a small hiatal hernia or a lower esophageal diverticulum.  The liver, spleen, gallbladder, pancreas, adrenal glands, kidneys, abdominal aorta, and retroperitoneal lymph nodes are unremarkable. The gastric wall is not thickened.  Small bowel are not distended. No obvious wall thickening.  No free air or free fluid in the abdomen.  Pelvis:  The distal transverse colon, descending colon, and rectosigmoid colon are decompressed.  There is suggestion of diffuse mural edema.  Infectious colitis or inflammatory bowel disease should be excluded although changes may just be due to under distension.  Stool filled ascending colon without wall thickening.  The uterus and adnexal structures are not enlarged.  Minimal free fluid in the pelvis, likely physiologic.  Bladder wall is not thickened.  No loculated pelvic fluid collections.  No significant pelvic  lymphadenopathy.  Normal alignment of the lumbar vertebrae.  IMPRESSION: Diffuse wall thickening and edema is suggested in the left colon, although changes may be due to under distension.  Colitis should be excluded.  No evidence of obstruction.  Small esophageal hiatal hernia versus lower esophageal diverticulum.  Minimal free fluid in the pelvis.  Original Report Authenticated By: Marlon Pel, M.D.       Brief H and P: For complete details please refer to admission H and P, but in brief    [name] is an 36 y.o. female who presents to the Ed on 12/23/11 with complaints of rectal bleeding which started in the afternoon. She reported seeing copious bright red blood in the toilet. She reported having constipation the day before, and taking 2 stool softeners the next Am. And in the afternoon her symptoms started. She denied any lightheadedness, weakness, fatigue SOB or Chest pain. She also denied having nausea or vomiting or fever. She  did report having crampy lower abdominal pain. She stated that she has not had any similar symptoms in the past. In the Ed she was found to have hematochezia, and was Heme+, her admission hemoglobin was 13.2, and she was hemodynamically stable.   Hospital Course:   Principal Problem:  *Rectal bleeding Active Problems:  Abdominal pain, lower  Colitis, acute Principal Problem:  *Rectal bleeding: Pt admitted to tele. CT as above.  Serial H&H's. IV fluids and  protonix and clear liquid diet. Also provided with flagyl and cipro.  Pt had no further episodes.Hg remained  stable.  Her VS remained stable. Will need OP follow up. Pt's only provider is her OB/Gyn and she requested that Dr. Alyce Pagan refer her to GI person in [place]. She will call in 1 week.  At discharge pt tolerating regular diet with minimal discomfort.  Active Problems:  Abdominal pain, lower: Pain medicine.  CT as above.  At discharge tolerating regular diet. Has passed flatus.   Colitis,  acute: no hx of same. CT abdomen as above. Pt provided with clear liquid diet. Quickly advanced. At discharge on regular diet with minimal discomfort. Recommended bland diet for a few days. Will follow up with MD 1 week.     Time spent on Discharge: 35 minutes  Signed: Gwenyth Bender 12/24/2011, 1:49 PM

## 2011-12-24 NOTE — Discharge Instructions (Addendum)
Colitis Colitis is inflammation of the colon. Colitis can be a short-term or long-standing (chronic) illness. Crohn's disease and ulcerative colitis are 2 types of colitis which are chronic. They usually require lifelong treatment. CAUSES  There are many different causes of colitis, including:  Viruses.   Germs (bacteria).   Medicine reactions.  SYMPTOMS   Diarrhea.   Intestinal bleeding.   Pain.   Fever.   Throwing up (vomiting).   Tiredness (fatigue).   Weight loss.   Bowel blockage.  DIAGNOSIS  The diagnosis of colitis is based on examination and stool or blood tests. X-rays, CT scan, and colonoscopy may also be needed. TREATMENT  Treatment may include:  Fluids given through the vein (intravenously).   Bowel rest (nothing to eat or drink for a period of time).   Medicine for pain and diarrhea.   Medicines (antibiotics) that kill germs.   Cortisone medicines.   Surgery.  HOME CARE INSTRUCTIONS   Get plenty of rest.   Drink enough water and fluids to keep your urine clear or pale yellow.   Eat a well-balanced diet.   Call your caregiver for follow-up as recommended.  SEEK IMMEDIATE MEDICAL CARE IF:   You develop chills.   You have an oral temperature above 102 F (38.9 C), not controlled by medicine.   You have extreme weakness, fainting, or dehydration.   You have repeated vomiting.   You develop severe belly (abdominal) pain or are passing bloody or tarry stools.  MAKE SURE YOU:   Understand these instructions.   Will watch your condition.   Will get help right away if you are not doing well or get worse.  Document Released: 09/26/2004 Document Revised: 08/08/2011 Document Reviewed: 12/22/2009 [employer] Patient Information 2012 [employer]. 

## 2013-04-29 ENCOUNTER — Telehealth: Payer: Self-pay | Admitting: Endocrinology

## 2013-04-29 NOTE — Telephone Encounter (Signed)
Call from rehabilitation unit at [facility]: Free T is 2.4; TSH 0.09, currently on 225 mcg They will reduce the dose to 175 until her next visit Also apparently has mild increase in calcium and PTH

## 2013-04-29 NOTE — Telephone Encounter (Signed)
Dr. Lucianne Muss - please call Philipp Deputy, PA-C regarding this patient. CB# # [phone]

## 2013-05-10 ENCOUNTER — Other Ambulatory Visit: Payer: Self-pay | Admitting: *Deleted

## 2013-05-10 DIAGNOSIS — E039 Hypothyroidism, unspecified: Secondary | ICD-10-CM

## 2013-05-10 DIAGNOSIS — E890 Postprocedural hypothyroidism: Secondary | ICD-10-CM | POA: Insufficient documentation

## 2013-05-14 ENCOUNTER — Other Ambulatory Visit: Payer: 59

## 2013-05-17 ENCOUNTER — Ambulatory Visit: Payer: 59 | Admitting: Endocrinology

## 2013-05-17 DIAGNOSIS — Z0289 Encounter for other administrative examinations: Secondary | ICD-10-CM

## 2013-06-25 ENCOUNTER — Ambulatory Visit (INDEPENDENT_AMBULATORY_CARE_PROVIDER_SITE_OTHER): Payer: BC Managed Care – PPO | Admitting: Endocrinology

## 2013-06-25 ENCOUNTER — Encounter: Payer: Self-pay | Admitting: Endocrinology

## 2013-06-25 VITALS — BP 102/60 | HR 67 | Temp 98.3°F | Ht 65.0 in | Wt 184.2 lb

## 2013-06-25 DIAGNOSIS — Z23 Encounter for immunization: Secondary | ICD-10-CM

## 2013-06-25 DIAGNOSIS — E039 Hypothyroidism, unspecified: Secondary | ICD-10-CM

## 2013-06-25 NOTE — Progress Notes (Signed)
Patient ID: [name], female   DOB: 10-11-1975, 37 y.o.   MRN: [id number]   Reason for Appointment:  Hypothyroidism, followup visit   History of Present Illness:   The hypothyroidism was first diagnosed  after her I-131 treatment for Graves' disease in 2003 She has been on relatively large doses of thyroxine supplement and has been irregular with her followup in the past Previous records are not available  Complaints are reported by the patient now are none except some fatigue at times. However he is also having difficulties with multiple sclerosis She has lost 15 pounds in the last few weeks No complaints of unusual cold intolerance, hair loss or dry skin She had been fairly regular with her medication in the last few months        Labs from rehabilitation unit at [facility] in late August: Free T = 2.4; TSH 0.09, at that time on 225 mcg Subsequently her dose was changed to 175 mcg and she is now here for followup  PROBLEM #2:  She was told that she had a mild increase in calcium and parathyroid hormone also but no results are available She has no history of bone pain, fracture or kidney stones No history of excessive vitamin D intake and prior calcium levels are not available     Medication List       This list is accurate as of: 06/25/13  4:02 PM.  Always use your most recent med list.               levothyroxine 200 MCG tablet  Commonly known as:  SYNTHROID, LEVOTHROID  Take 200 mcg by mouth daily.     UNABLE TO FIND  Norie may return to work 12/26/11 with no restrictions        Allergies:  Allergies  Allergen Reactions  . Vicodin [Hydrocodone-Acetaminophen] Nausea And Vomiting    Past Medical History  Diagnosis Date  . Hypothyroid   . Varicose veins     Past Surgical History  Procedure Laterality Date  . Vein ligation and stripping      Family History  Problem Relation Age of Onset  . Diabetes type I Father   . Diabetes type II Sister   .  Hypertension Sister     Social History:  reports that she has never smoked. She does not have any smokeless tobacco history on file. She reports that she does not drink alcohol or use illicit drugs.  REVIEW Of SYSTEMS:  She is on no treatment for multiple sclerosis  She has regular menstrual cycles   Examination:   BP 102/60  Pulse 67  Temp(Src) 98.3 F (36.8 C) (Oral)  Ht 5\' 5"  (1.651 m)  Wt 184 lb 3.2 oz (83.553 kg)  BMI 30.65 kg/m2  SpO2 98%   GENERAL APPEARANCE:  she is a well-looking but her speech is slightly slow and hoarse   FACE: No puffiness of face.Marland Kitchen         NECK: no thyromegaly and no masses palpable in the neck .          NEUROLOGIC EXAM: DTRs 2+ bilaterally at biceps. No pedal edema She is able to walk but slowly    Assessments   Hypothyroidism, post ablative. On exam looks euthyroid but we'll need to check her TSH today  ? Hyperparathyroidism: We'll recheck her calcium and PTH    Treatment:  To be decided  [facility] 06/25/2013, 4:02 PM

## 2013-06-26 LAB — PARATHYROID HORMONE, INTACT (NO CA): PTH: 24 pg/mL (ref 15–65)

## 2013-07-01 ENCOUNTER — Ambulatory Visit: Payer: Medicaid Other

## 2013-07-01 ENCOUNTER — Other Ambulatory Visit: Payer: BC Managed Care – PPO

## 2013-07-01 DIAGNOSIS — E039 Hypothyroidism, unspecified: Secondary | ICD-10-CM

## 2013-07-01 LAB — BASIC METABOLIC PANEL
BUN: 13 mg/dL (ref 6–23)
CO2: 29 mEq/L (ref 19–32)
Calcium: 10.7 mg/dL — ABNORMAL HIGH (ref 8.4–10.5)
Chloride: 103 mEq/L (ref 96–112)
Creatinine, Ser: 1 mg/dL (ref 0.4–1.2)
GFR: 83.51 mL/min (ref >60.00)
Glucose, Bld: 92 mg/dL (ref 70–99)
Potassium: 3.5 mEq/L (ref 3.5–5.1)
Sodium: 141 mEq/L (ref 135–145)

## 2013-07-01 LAB — TSH: TSH: 0.09 u[IU]/mL — ABNORMAL LOW (ref 0.35–5.50)

## 2013-07-01 LAB — T4, FREE: Free T4: 0.69 ng/dL (ref 0.60–1.60)

## 2013-07-02 ENCOUNTER — Other Ambulatory Visit: Payer: BC Managed Care – PPO

## 2013-07-02 ENCOUNTER — Telehealth: Payer: Self-pay | Admitting: *Deleted

## 2013-07-02 NOTE — Telephone Encounter (Signed)
Message copied by Hermenia Bers on Fri Jul 02, 2013  1:05 PM ------      Message from: Reather Littler      Created: Thu Jul 01, 2013  4:57 PM       Her thyroid level is still a little high. Need to change her dosage from 175 down to 150 mcg of levothyroxine, will need followup in 2 months with labs      Calcium test is very slightly high but only needs to be monitored for now ------

## 2013-07-02 NOTE — Telephone Encounter (Signed)
Left message to return call 

## 2013-07-06 ENCOUNTER — Other Ambulatory Visit: Payer: Self-pay | Admitting: *Deleted

## 2013-07-06 MED ORDER — LEVOTHYROXINE SODIUM 150 MCG PO TABS: 150.0000 ug | ORAL_TABLET | Freq: Every day | ORAL | Status: DC

## 2014-03-02 ENCOUNTER — Other Ambulatory Visit: Payer: Self-pay

## 2014-03-28 ENCOUNTER — Other Ambulatory Visit (INDEPENDENT_AMBULATORY_CARE_PROVIDER_SITE_OTHER): Payer: Medicaid Other

## 2014-03-28 DIAGNOSIS — E039 Hypothyroidism, unspecified: Secondary | ICD-10-CM

## 2014-03-28 LAB — T4, FREE: Free T4: 0.76 ng/dL (ref 0.60–1.60)

## 2014-03-28 LAB — TSH: TSH: 3.98 u[IU]/mL (ref 0.35–4.50)

## 2014-03-31 ENCOUNTER — Other Ambulatory Visit (HOSPITAL_COMMUNITY): Payer: Self-pay | Admitting: Obstetrics and Gynecology

## 2014-03-31 ENCOUNTER — Ambulatory Visit (INDEPENDENT_AMBULATORY_CARE_PROVIDER_SITE_OTHER): Payer: Medicaid Other | Admitting: Endocrinology

## 2014-03-31 ENCOUNTER — Encounter: Payer: Self-pay | Admitting: Endocrinology

## 2014-03-31 ENCOUNTER — Other Ambulatory Visit: Payer: Self-pay | Admitting: *Deleted

## 2014-03-31 ENCOUNTER — Other Ambulatory Visit: Payer: Self-pay | Admitting: Endocrinology

## 2014-03-31 VITALS — BP 116/68 | HR 85 | Temp 98.3°F | Resp 16 | Ht 65.0 in | Wt 214.8 lb

## 2014-03-31 DIAGNOSIS — E890 Postprocedural hypothyroidism: Secondary | ICD-10-CM

## 2014-03-31 DIAGNOSIS — O09529 Supervision of elderly multigravida, unspecified trimester: Secondary | ICD-10-CM

## 2014-03-31 MED ORDER — LEVOTHYROXINE SODIUM 150 MCG PO TABS: ORAL_TABLET | ORAL | Status: DC

## 2014-03-31 NOTE — Progress Notes (Signed)
In the Patient ID: [name], female   DOB: [birth year], 38 y.o.   MRN: [id number]   Reason for Appointment:  Hypothyroidism, followup visit   History of Present Illness:   HYPOTHYROIDISM was first diagnosed  after her I-131 treatment for Graves' disease in 2003 She had been on relatively large doses of thyroxine supplement up to 225 mcg and has been irregular with her followup  She was last seen in 10/14 and her dose was reduced from 175 down to 150 mcg but she did not followup as scheduled  When she was seen by her gynecologist for initial obstetric care she had run out of her Synthroid for a week and her TSH was high at 19.8 with a low T4 level. She had otherwise been fairly regular with her medication in the last few months  Since then she has resumed her Synthroid 150 mcg but she still feels significantly tired. She tends to get sleepy She has gained the usual amount of weight for early pregnancy, now is 14 weeks into pregnancy  Lab Results  Component Value Date   TSH 3.98 03/28/2014        Medication List       This list is accurate as of: 03/31/14  4:10 PM.  Always use your most recent med list.               levothyroxine 150 MCG tablet  Commonly known as:  SYNTHROID, LEVOTHROID  Take 1 tablet (150 mcg total) by mouth daily.        Allergies:  Allergies  Allergen Reactions  . Vicodin [Hydrocodone-Acetaminophen] Nausea And Vomiting    Past Medical History  Diagnosis Date  . Hypothyroid   . Varicose veins     Past Surgical History  Procedure Laterality Date  . Vein ligation and stripping      Family History  Problem Relation Age of Onset  . Diabetes type I Father   . Diabetes type II Sister   . Hypertension Sister     Social History:  reports that she has never smoked. She does not have any smokeless tobacco history on file. She reports that she does not drink alcohol or use illicit drugs.  REVIEW Of SYSTEMS:  She is on no treatment for  multiple sclerosis  She had a high normal calcium of 10.2 last October and subsequently 10.7 but PTH was low normal   Examination:   BP 116/68  Pulse 85  Temp(Src) 98.3 F (36.8 C)  Resp 16  Ht 5\' 5"  (1.651 m)  Wt 214 lb 12.8 oz (97.433 kg)  BMI 35.74 kg/m2  SpO2 98%   GENERAL APPEARANCE:  she looks well  FACE: No puffiness of face.Marland Kitchen                NEUROLOGIC EXAM: DTRs 2+ bilaterally at biceps. No pedal edema    Assessments/plan:   Hypothyroidism, post ablative. On exam looks euthyroid and her TSH is now 3.9 Most likely her fatigue is related to her early pregnancy She will continue 150 mcg but take 7-1/2 tablets a week Followup in 6 weeks and regularly throughout the pregnancy  History of hypercalcemia: Will reassess after her pregnancy   Ion Gonnella 03/31/2014, 4:10 PM

## 2014-03-31 NOTE — Patient Instructions (Signed)
Extra 1/2 tab once a week

## 2014-04-28 ENCOUNTER — Encounter (HOSPITAL_COMMUNITY): Payer: Self-pay

## 2014-04-28 ENCOUNTER — Ambulatory Visit (HOSPITAL_COMMUNITY)
Admission: RE | Admit: 2014-04-28 | Discharge: 2014-04-28 | Disposition: A | Discharge status: Home / Self Care | Payer: Medicaid Other | Source: Ambulatory Visit | Attending: Obstetrics and Gynecology | Admitting: Obstetrics and Gynecology

## 2014-04-28 ENCOUNTER — Other Ambulatory Visit (HOSPITAL_COMMUNITY): Payer: Self-pay | Admitting: Obstetrics and Gynecology

## 2014-04-28 DIAGNOSIS — O09529 Supervision of elderly multigravida, unspecified trimester: Secondary | ICD-10-CM

## 2014-04-28 DIAGNOSIS — O09212 Supervision of pregnancy with history of pre-term labor, second trimester: Secondary | ICD-10-CM

## 2014-04-28 DIAGNOSIS — O9989 Other specified diseases and conditions complicating pregnancy, childbirth and the puerperium: Secondary | ICD-10-CM

## 2014-04-28 DIAGNOSIS — E039 Hypothyroidism, unspecified: Secondary | ICD-10-CM | POA: Diagnosis not present

## 2014-04-28 DIAGNOSIS — E079 Disorder of thyroid, unspecified: Secondary | ICD-10-CM | POA: Diagnosis not present

## 2014-04-28 DIAGNOSIS — O99280 Endocrine, nutritional and metabolic diseases complicating pregnancy, unspecified trimester: Secondary | ICD-10-CM

## 2014-04-28 DIAGNOSIS — IMO0002 Reserved for concepts with insufficient information to code with codable children: Secondary | ICD-10-CM | POA: Diagnosis present

## 2014-04-28 DIAGNOSIS — O99891 Other specified diseases and conditions complicating pregnancy: Secondary | ICD-10-CM | POA: Diagnosis not present

## 2014-04-28 DIAGNOSIS — O4412 Placenta previa with hemorrhage, second trimester: Secondary | ICD-10-CM

## 2014-04-28 DIAGNOSIS — G35 Multiple sclerosis: Secondary | ICD-10-CM | POA: Diagnosis not present

## 2014-04-29 NOTE — Progress Notes (Signed)
Genetic Counseling  High-Risk Gestation Note  Appointment Date:  04/28/2014 Referred By: Senaida Ores, MD Date of Birth:  Jan 05, 1976 Partner:  Frederico Hamman Minor    Pregnancy History: [id number] Estimated Date of Delivery: 09/26/14 Estimated Gestational Age: [redacted]w[redacted]d Attending: Renella Cunas, MD   Ms. Corrin Parker and her partner, Mr. Spencer Minor, were seen for genetic counseling because of a maternal age of 38 y.o.Marland Kitchen     They were counseled regarding maternal age and the association with risk for chromosome conditions due to nondisjunction with aging of the ova.   We reviewed chromosomes, nondisjunction, and the associated 1 in 27 risk for fetal aneuploidy related to a maternal age of 39 y.o. at [redacted]w[redacted]d gestation.  They were counseled that the risk for aneuploidy decreases as gestational age increases, accounting for those pregnancies which spontaneously abort.  We specifically discussed Down syndrome (trisomy 71), trisomies 19 and 86, and sex chromosome aneuploidies (47,XXX and 47,XXY) including the common features and prognoses of each.   Ms. Busser' OB medical records indicated that noninvasive prenatal screening (NIPS)/cell free DNA testing was performed in the first trimester, InformaSeq through [facility]. The patient reported that the results were reported to be normal female. We did not have a copy of the lab report at the time of today's visit to confirm this report. We reviewed that this screen assesses the pregnancy specific risk for trisomy 15, trisomy 84, trisomy 77, and sex chromosome aneuploidy.  Detections rates for InformaSeq are reportedly >99% for trisomy 21, 97% for trisomy 18, and 87% for trisomy 75. It is not diagnostic of these conditions, nor does it assess for all chromosome or genetic conditions.  We reviewed available additional screening option of detailed ultrasound.  We reviewed the benefits and limitations of detailed ultrasound.  They were also counseled regarding diagnostic testing  via amniocentesis. We reviewed the approximate 1 in [phone] risk for complications for amniocentesis, including spontaneous pregnancy loss. After consideration of all the options, they elected to proceed with targeted ultrasound only and declined amniocentesis.   A complete ultrasound was performed today. The ultrasound report will be sent under separate cover. There were no visualized fetal anomalies or markers suggestive of aneuploidy. Complete ultrasound results reported separately. Diagnostic testing was declined today.  They understand that screening tests cannot rule out all birth defects or genetic syndromes. The patient was advised of this limitation and states she still does not want additional testing at this time.   Ms. MERTIS MOSHER was provided with written information regarding sickle cell anemia (SCA) including the carrier frequency and incidence in the African-American population, the availability of carrier testing and prenatal diagnosis if indicated.  In addition, we discussed that hemoglobinopathies are routinely screened for as part of the [place] newborn screening panel.  Hemoglobin electrophoresis was previously performed through her OB office and reported the presence of normal adult hemoglobin.  Both family histories were reviewed and found to be noncontributory for birth defects, intellectual disability, and known genetic conditions. Ms. Seidner reported that her first pregnancy, with a different partner, was a son who died at age 66 days. He was born full term and was reportedly healthy. She reported no complications during the pregnancy and no known birth defects. The underlying etiology is not known. Her additional two sons are reportedly healthy. Without further information regarding the provided family history, an accurate genetic risk cannot be calculated. Further genetic counseling is warranted if more information is obtained.   Ms. WANITA DERENZO denied  exposure to environmental  toxins or chemical agents. She denied the use of alcohol, tobacco or street drugs. She denied significant viral illnesses during the course of her pregnancy. Her medical and surgical histories were contributory for multiple sclerosis, which was reportedly diagnosed in august 2014. She reported that she was taking Gilenya for treatment and discontinued this medication at the time of a positive pregnancy test, which was reportedly approximately 4-[redacted] weeks gestation.  The all-or-none period was discussed, meaning exposures that occur in the first 4 weeks of gestation are typically thought to either not affect the pregnancy at all or result in a miscarriage. Limited available animal studies, indicate an increased risk for birth defects and decreased fetal viability associated with use of fingolimod (Gilenya) during pregnancy in rats. Human data are not available regarding prenatal use. Given the timing of the exposure, we discussed that increased risk would likely be low. Targeted ultrasound is available to assess fetal growth and anatomy. The patient also reported that she is taking levothyroxine during pregnancy for treatment of hypothyroidism.    I counseled this couple regarding the above risks and available options.  The approximate face-to-face time with the genetic counselor was 35 minutes.  Chipper Oman, MS,  Certified [employer] 04/29/2014

## 2014-05-02 ENCOUNTER — Other Ambulatory Visit: Payer: BC Managed Care – PPO

## 2014-05-02 ENCOUNTER — Other Ambulatory Visit (INDEPENDENT_AMBULATORY_CARE_PROVIDER_SITE_OTHER): Payer: Medicaid Other

## 2014-05-02 DIAGNOSIS — E890 Postprocedural hypothyroidism: Secondary | ICD-10-CM

## 2014-05-02 LAB — RENAL FUNCTION PANEL
Albumin: 3.2 g/dL — ABNORMAL LOW (ref 3.5–5.2)
BUN: 5 mg/dL — ABNORMAL LOW (ref 6–23)
CO2: 22 mEq/L (ref 19–32)
Calcium: 10.7 mg/dL — ABNORMAL HIGH (ref 8.4–10.5)
Chloride: 106 mEq/L (ref 96–112)
Creatinine, Ser: 0.8 mg/dL (ref 0.4–1.2)
GFR: 98.34 mL/min (ref >60.00)
Glucose, Bld: 91 mg/dL (ref 70–99)
Phosphorus: 2.7 mg/dL (ref 2.3–4.6)
Potassium: 3.6 mEq/L (ref 3.5–5.1)
Sodium: 136 mEq/L (ref 135–145)

## 2014-05-02 LAB — TSH: TSH: 2.99 u[IU]/mL (ref 0.35–4.50)

## 2014-05-05 ENCOUNTER — Ambulatory Visit: Payer: BC Managed Care – PPO | Admitting: Endocrinology

## 2014-05-16 ENCOUNTER — Other Ambulatory Visit (HOSPITAL_COMMUNITY): Payer: Self-pay | Admitting: Obstetrics and Gynecology

## 2014-05-16 DIAGNOSIS — O09522 Supervision of elderly multigravida, second trimester: Secondary | ICD-10-CM

## 2014-05-16 DIAGNOSIS — O09299 Supervision of pregnancy with other poor reproductive or obstetric history, unspecified trimester: Secondary | ICD-10-CM

## 2014-05-16 DIAGNOSIS — O99891 Other specified diseases and conditions complicating pregnancy: Secondary | ICD-10-CM

## 2014-05-16 DIAGNOSIS — Z8751 Personal history of pre-term labor: Secondary | ICD-10-CM

## 2014-05-16 DIAGNOSIS — O358XX0 Maternal care for other (suspected) fetal abnormality and damage, not applicable or unspecified: Secondary | ICD-10-CM

## 2014-05-16 DIAGNOSIS — O9989 Other specified diseases and conditions complicating pregnancy, childbirth and the puerperium: Secondary | ICD-10-CM

## 2014-05-16 DIAGNOSIS — O99210 Obesity complicating pregnancy, unspecified trimester: Secondary | ICD-10-CM

## 2014-05-16 DIAGNOSIS — E669 Obesity, unspecified: Secondary | ICD-10-CM

## 2014-05-20 ENCOUNTER — Ambulatory Visit (HOSPITAL_COMMUNITY)
Admission: RE | Admit: 2014-05-20 | Discharge: 2014-05-20 | Disposition: A | Discharge status: Home / Self Care | Payer: Medicaid Other | Source: Ambulatory Visit | Attending: Obstetrics and Gynecology | Admitting: Obstetrics and Gynecology

## 2014-05-20 ENCOUNTER — Encounter (HOSPITAL_COMMUNITY): Payer: Self-pay

## 2014-05-20 DIAGNOSIS — O09522 Supervision of elderly multigravida, second trimester: Secondary | ICD-10-CM

## 2014-05-20 DIAGNOSIS — O358XX0 Maternal care for other (suspected) fetal abnormality and damage, not applicable or unspecified: Secondary | ICD-10-CM | POA: Insufficient documentation

## 2014-05-20 DIAGNOSIS — O99891 Other specified diseases and conditions complicating pregnancy: Secondary | ICD-10-CM | POA: Insufficient documentation

## 2014-05-20 DIAGNOSIS — O09299 Supervision of pregnancy with other poor reproductive or obstetric history, unspecified trimester: Secondary | ICD-10-CM | POA: Insufficient documentation

## 2014-05-20 DIAGNOSIS — Z8751 Personal history of pre-term labor: Secondary | ICD-10-CM | POA: Insufficient documentation

## 2014-05-20 DIAGNOSIS — O9989 Other specified diseases and conditions complicating pregnancy, childbirth and the puerperium: Secondary | ICD-10-CM

## 2014-05-20 DIAGNOSIS — O09529 Supervision of elderly multigravida, unspecified trimester: Secondary | ICD-10-CM | POA: Diagnosis not present

## 2014-05-20 DIAGNOSIS — E669 Obesity, unspecified: Secondary | ICD-10-CM

## 2014-05-20 DIAGNOSIS — O99210 Obesity complicating pregnancy, unspecified trimester: Secondary | ICD-10-CM

## 2014-05-26 ENCOUNTER — Other Ambulatory Visit (HOSPITAL_COMMUNITY): Payer: Self-pay | Admitting: Obstetrics and Gynecology

## 2014-05-26 DIAGNOSIS — O09299 Supervision of pregnancy with other poor reproductive or obstetric history, unspecified trimester: Secondary | ICD-10-CM

## 2014-05-26 DIAGNOSIS — O9989 Other specified diseases and conditions complicating pregnancy, childbirth and the puerperium: Secondary | ICD-10-CM

## 2014-05-26 DIAGNOSIS — O99891 Other specified diseases and conditions complicating pregnancy: Secondary | ICD-10-CM

## 2014-05-26 DIAGNOSIS — Z8751 Personal history of pre-term labor: Secondary | ICD-10-CM

## 2014-05-26 DIAGNOSIS — G35 Multiple sclerosis: Secondary | ICD-10-CM

## 2014-05-26 DIAGNOSIS — O99210 Obesity complicating pregnancy, unspecified trimester: Secondary | ICD-10-CM

## 2014-05-26 DIAGNOSIS — E669 Obesity, unspecified: Secondary | ICD-10-CM

## 2014-05-26 DIAGNOSIS — O4412 Placenta previa with hemorrhage, second trimester: Secondary | ICD-10-CM

## 2014-05-30 ENCOUNTER — Ambulatory Visit: Payer: Medicaid Other | Admitting: Endocrinology

## 2014-06-01 ENCOUNTER — Ambulatory Visit (HOSPITAL_COMMUNITY)
Admission: RE | Admit: 2014-06-01 | Discharge: 2014-06-01 | Disposition: A | Discharge status: Home / Self Care | Payer: Medicaid Other | Source: Ambulatory Visit | Attending: Obstetrics and Gynecology | Admitting: Obstetrics and Gynecology

## 2014-06-01 ENCOUNTER — Encounter (HOSPITAL_COMMUNITY): Payer: Self-pay

## 2014-06-01 ENCOUNTER — Other Ambulatory Visit (HOSPITAL_COMMUNITY): Payer: Self-pay | Admitting: Obstetrics and Gynecology

## 2014-06-01 VITALS — BP 110/41 | HR 72 | Wt 213.8 lb

## 2014-06-01 DIAGNOSIS — E669 Obesity, unspecified: Secondary | ICD-10-CM | POA: Insufficient documentation

## 2014-06-01 DIAGNOSIS — O99891 Other specified diseases and conditions complicating pregnancy: Secondary | ICD-10-CM

## 2014-06-01 DIAGNOSIS — O9989 Other specified diseases and conditions complicating pregnancy, childbirth and the puerperium: Secondary | ICD-10-CM

## 2014-06-01 DIAGNOSIS — O99210 Obesity complicating pregnancy, unspecified trimester: Secondary | ICD-10-CM

## 2014-06-01 DIAGNOSIS — O4410 Placenta previa with hemorrhage, unspecified trimester: Secondary | ICD-10-CM | POA: Insufficient documentation

## 2014-06-01 DIAGNOSIS — O4412 Placenta previa with hemorrhage, second trimester: Secondary | ICD-10-CM

## 2014-06-01 DIAGNOSIS — Z8751 Personal history of pre-term labor: Secondary | ICD-10-CM | POA: Insufficient documentation

## 2014-06-01 DIAGNOSIS — O26879 Cervical shortening, unspecified trimester: Secondary | ICD-10-CM | POA: Insufficient documentation

## 2014-06-01 DIAGNOSIS — O26872 Cervical shortening, second trimester: Secondary | ICD-10-CM

## 2014-06-01 DIAGNOSIS — G35 Multiple sclerosis: Secondary | ICD-10-CM

## 2014-06-01 DIAGNOSIS — O09299 Supervision of pregnancy with other poor reproductive or obstetric history, unspecified trimester: Secondary | ICD-10-CM | POA: Insufficient documentation

## 2014-06-03 ENCOUNTER — Ambulatory Visit (HOSPITAL_COMMUNITY): Payer: Medicaid Other

## 2014-06-10 ENCOUNTER — Other Ambulatory Visit (HOSPITAL_COMMUNITY): Payer: Self-pay | Admitting: Obstetrics and Gynecology

## 2014-06-10 DIAGNOSIS — O09292 Supervision of pregnancy with other poor reproductive or obstetric history, second trimester: Secondary | ICD-10-CM

## 2014-06-10 DIAGNOSIS — O4402 Placenta previa specified as without hemorrhage, second trimester: Secondary | ICD-10-CM

## 2014-06-10 DIAGNOSIS — E039 Hypothyroidism, unspecified: Secondary | ICD-10-CM

## 2014-06-10 DIAGNOSIS — O99212 Obesity complicating pregnancy, second trimester: Secondary | ICD-10-CM

## 2014-06-10 DIAGNOSIS — O99352 Diseases of the nervous system complicating pregnancy, second trimester: Secondary | ICD-10-CM

## 2014-06-10 DIAGNOSIS — O3432 Maternal care for cervical incompetence, second trimester: Secondary | ICD-10-CM

## 2014-06-10 DIAGNOSIS — Z3A25 25 weeks gestation of pregnancy: Secondary | ICD-10-CM

## 2014-06-10 DIAGNOSIS — O99282 Endocrine, nutritional and metabolic diseases complicating pregnancy, second trimester: Secondary | ICD-10-CM

## 2014-06-10 DIAGNOSIS — G35 Multiple sclerosis: Secondary | ICD-10-CM

## 2014-06-15 ENCOUNTER — Ambulatory Visit (HOSPITAL_COMMUNITY): Payer: No Typology Code available for payment source | Attending: Obstetrics and Gynecology

## 2014-06-29 ENCOUNTER — Ambulatory Visit (HOSPITAL_COMMUNITY): Payer: No Typology Code available for payment source

## 2014-07-04 ENCOUNTER — Encounter (HOSPITAL_COMMUNITY): Payer: Self-pay

## 2015-03-03 ENCOUNTER — Encounter (HOSPITAL_COMMUNITY): Payer: Self-pay | Admitting: *Deleted

## 2015-04-11 ENCOUNTER — Encounter: Payer: Self-pay | Admitting: Cardiovascular Disease

## 2015-05-09 ENCOUNTER — Encounter: Payer: Self-pay | Admitting: Cardiovascular Disease

## 2015-06-21 DIAGNOSIS — C50412 Malignant neoplasm of upper-outer quadrant of left female breast: Secondary | ICD-10-CM | POA: Insufficient documentation

## 2016-01-26 ENCOUNTER — Emergency Department (HOSPITAL_BASED_OUTPATIENT_CLINIC_OR_DEPARTMENT_OTHER): Payer: BLUE CROSS/BLUE SHIELD

## 2016-01-26 ENCOUNTER — Emergency Department (HOSPITAL_BASED_OUTPATIENT_CLINIC_OR_DEPARTMENT_OTHER)
Admission: EM | Admit: 2016-01-26 | Discharge: 2016-01-26 | Disposition: A | Discharge status: Home / Self Care | Payer: BLUE CROSS/BLUE SHIELD | Attending: Emergency Medicine | Admitting: Emergency Medicine

## 2016-01-26 ENCOUNTER — Encounter (HOSPITAL_BASED_OUTPATIENT_CLINIC_OR_DEPARTMENT_OTHER): Payer: Self-pay | Admitting: *Deleted

## 2016-01-26 DIAGNOSIS — E039 Hypothyroidism, unspecified: Secondary | ICD-10-CM | POA: Diagnosis not present

## 2016-01-26 DIAGNOSIS — J189 Pneumonia, unspecified organism: Secondary | ICD-10-CM | POA: Insufficient documentation

## 2016-01-26 DIAGNOSIS — Z79899 Other long term (current) drug therapy: Secondary | ICD-10-CM | POA: Diagnosis not present

## 2016-01-26 DIAGNOSIS — R0789 Other chest pain: Secondary | ICD-10-CM | POA: Diagnosis present

## 2016-01-26 DIAGNOSIS — Z853 Personal history of malignant neoplasm of breast: Secondary | ICD-10-CM | POA: Insufficient documentation

## 2016-01-26 HISTORY — DX: Malignant neoplasm of unspecified site of unspecified female breast: C50.919

## 2016-01-26 LAB — BASIC METABOLIC PANEL
Anion gap: 6 (ref 5–15)
BUN: 10 mg/dL (ref 6–20)
CO2: 25 mmol/L (ref 22–32)
Calcium: 9.8 mg/dL (ref 8.9–10.3)
Chloride: 107 mmol/L (ref 101–111)
Creatinine, Ser: 0.94 mg/dL (ref 0.44–1.00)
GFR calc Af Amer: >60 mL/min (ref >60)
GFR calc non Af Amer: >60 mL/min (ref >60)
Glucose, Bld: 94 mg/dL (ref 65–99)
Potassium: 3.6 mmol/L (ref 3.5–5.1)
Sodium: 138 mmol/L (ref 135–145)

## 2016-01-26 LAB — CBC
HCT: 27.6 % — ABNORMAL LOW (ref 36.0–46.0)
Hemoglobin: 9.1 g/dL — ABNORMAL LOW (ref 12.0–15.0)
MCH: 34.3 pg — ABNORMAL HIGH (ref 26.0–34.0)
MCHC: 33 g/dL (ref 30.0–36.0)
MCV: 104.2 fL — ABNORMAL HIGH (ref 78.0–100.0)
Platelets: 312 10*3/uL (ref 150–400)
RBC: 2.65 MIL/uL — ABNORMAL LOW (ref 3.87–5.11)
RDW: 12.3 % (ref 11.5–15.5)
WBC: 4.7 10*3/uL (ref 4.0–10.5)

## 2016-01-26 LAB — D-DIMER, QUANTITATIVE: D-Dimer, Quant: 1.19 ug/mL-FEU — ABNORMAL HIGH (ref 0.00–0.50)

## 2016-01-26 LAB — TROPONIN I: Troponin I: <0.03 ng/mL (ref <0.031)

## 2016-01-26 MED ORDER — IOPAMIDOL (ISOVUE-370) INJECTION 76%
100.0000 mL | Freq: Once | INTRAVENOUS | Status: AC | PRN
  Administered 2016-01-26: 100 mL via INTRAVENOUS

## 2016-01-26 MED ORDER — DOXYCYCLINE HYCLATE 100 MG PO CAPS: 100.0000 mg | ORAL_CAPSULE | Freq: Two times a day (BID) | ORAL | Status: DC

## 2016-01-26 NOTE — ED Provider Notes (Signed)
CSN: [id number]     Arrival date & time 01/26/16  1807 History  By signing my name below, I, Mesha Guinyard, attest that this documentation has been prepared under the direction and in the presence of Treatment Team:  Attending Provider: Leo Grosser, MD.  Electronically Signed: Verlee Monte, Medical Scribe. 01/26/2016. 7:22 PM. Chief Complaint  Patient presents with  . Chest Pain   The history is provided by the patient. No language interpreter was used.   HPI Comments: [name] is a 39 y.o. female who presents to the Emergency Department complaining of tight upper chest pain onset this morning. Pt reports having a burning in her lower back onset 2 days ago. Pt reports she had a angio done at the [facility] and they removed 95% of the blood clot in her heart. Pt mentions she was asymptomatic. Pt takes lovenox injections for anticoagulation. Pt states the pain occurs when she takes a deep breath. Pt has hemoglobain of 8.9 at baseline with records from recent admission. Pt reports she doesn't take iron. She denies SOB and any other symptoms. 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 Her last treatment was march 10th.   Past Medical History  Diagnosis Date  . Hypothyroid   . Varicose veins   . H/O echocardiogram 02/20/2007, 06/03/2002    normal transthoracic echocardiogram  . Breast cancer [facility])    Past Surgical History  Procedure Laterality Date  . Vein ligation and stripping     Family History  Problem Relation Age of Onset  . Diabetes type I Father   . Diabetes type II Sister   . Hypertension Sister   . Diabetes Sister   . Kidney failure Mother   . Kidney disease Maternal Grandmother   . Diabetes Paternal Grandmother    Social History  Substance Use Topics  . Smoking status: Never Smoker   . Smokeless tobacco: None  . Alcohol Use: No   OB History    Gravida Para Term Preterm AB TAB SAB Ectopic Multiple Living    '5 3 2 1 1 1    2     ' Review of Systems  Respiratory: Positive for chest tightness. Negative for shortness of breath.   All other systems reviewed and are negative.   Allergies  Vicodin  Home Medications   Prior to Admission medications   Medication Sig Start Date End Date Taking? Authorizing Provider  enoxaparin (LOVENOX) 120 MG/0.8ML injection Inject 120 mg into the skin.   Yes Historical Provider, MD  magnesium 30 MG tablet Take 400 mg by mouth 2 (two) times daily.   Yes Historical Provider, MD  levothyroxine (SYNTHROID, LEVOTHROID) 150 MCG tablet TAKE 1 TABLET BY MOUTH EVERY DAY AND 1 EXTRA TABLET WEEKLY 03/31/14   Elayne Snare, MD   BP 116/61 mmHg  Pulse 65  Temp(Src) 98.2 F (36.8 C)  Resp 18  SpO2 100% Physical Exam  Constitutional: She is oriented to person, place, and time. She appears well-developed and well-nourished. No distress.  HENT:  Head: Normocephalic.  Eyes: Conjunctivae are normal.  Neck: Neck supple. No tracheal deviation present.  Cardiovascular: Normal rate, regular rhythm and normal heart sounds.  Exam reveals no gallop and no friction rub.   No murmur heard. Pulmonary/Chest: Effort normal and breath sounds normal. No respiratory distress. She has no wheezes. She has no rales.  Abdominal: Soft. She exhibits no distension.  Neurological: She is alert  and oriented to person, place, and time.  Skin: Skin is warm and dry.  Psychiatric: She has a normal mood and affect.    ED Course  Procedures  DIAGNOSTIC STUDIES: Oxygen Saturation is 100% on RA, NL by my interpreOxygen tation.    COORDINATION OF CARE: 7:22 PM Discussed treatment plan with pt at bedside and pt agreed to plan.  Labs Review Labs Reviewed  CBC - Abnormal; Notable for the following:    RBC 2.65 (*)    Hemoglobin 9.1 (*)    HCT 27.6 (*)    MCV 104.2 (*)    MCH 34.3 (*)    All other components within normal limits  D-DIMER, QUANTITATIVE (NOT AT [facility]) - Abnormal; Notable for the  following:    D-Dimer, Quant 1.19 (*)    All other components within normal limits  BASIC METABOLIC PANEL  TROPONIN I    Imaging Review Dg Chest 2 View  01/26/2016  CLINICAL DATA:  Chest pain. EXAM: CHEST  2 VIEW COMPARISON:  None. FINDINGS: The heart size and mediastinal contours are within normal limits. Both lungs are clear. No pneumothorax or pleural effusion is noted. Right subclavian Port-A-Cath is noted with distal tip in expected position of cavoatrial junction. The visualized skeletal structures are unremarkable. IMPRESSION: No active cardiopulmonary disease. Electronically Signed   By: Marijo Conception, M.D.   On: 01/26/2016 18:58   Ct Angio Chest Pe W/cm &/or Wo Cm  01/26/2016  CLINICAL DATA:  Right back pain, chest pain for 4 days EXAM: CT ANGIOGRAPHY CHEST WITH CONTRAST TECHNIQUE: Multidetector CT imaging of the chest was performed using the standard protocol during bolus administration of intravenous contrast. Multiplanar CT image reconstructions and MIPs were obtained to evaluate the vascular anatomy. CONTRAST:  100 mL Isovue 370 COMPARISON:  None. FINDINGS: There is adequate opacification of the pulmonary arteries. There is no pulmonary embolus. The main pulmonary artery, right main pulmonary artery and left main pulmonary arteries are normal in size. The heart size is normal. There is no pericardial effusion. Small mild patchy ground-glass opacity in the right upper lobe. No focal consolidation, pleural effusion or pneumothorax. There is no axillary, hilar, or mediastinal adenopathy. There is no lytic or blastic osseous lesion. The visualized portions of the upper abdomen are unremarkable. Review of the MIP images confirms the above findings. IMPRESSION: 1. No evidence of pulmonary embolus. 2. Small mild patchy area of ground-glass opacity in the right upper lobe which may be secondary to an infectious or inflammatory etiology. Electronically Signed   By: Kathreen Devoid   On: 01/26/2016  20:23   I have personally reviewed and evaluated these images and lab results as part of my medical decision-making.   EKG Interpretation   Date/Time:  Friday Jan 26 2016 18:13:21 EDT Ventricular Rate:  69 PR Interval:  186 QRS Duration: 86 QT Interval:  384 QTC Calculation: 411 R Axis:   83 Text Interpretation:  Normal sinus rhythm Nonspecific T wave abnormality  Abnormal ECG Since last tracing rate slower Otherwise no significant  change Confirmed by Levina Boyack MD, Quinne Pires ([id number]) on 01/26/2016 7:40:55 PM      She had opacity in CT - conserning for possible infection  MDM   Final diagnoses:  Atypical pneumonia    40 y.o. female presents with feeling of chest tightness. Medically complex patient s/p multiple rounds of chemotherapy and [place]ard[place] her [place]. She is anticoagulated on lovenox at treatment dose for PE.  She is high risk, d-dimer elevated from triage and plain film negative so will CT for r/o of new embolus. Small area of groundglass opacity may represent atypical pneumonia, no signs of developing sepsis or new embolus and Pt overall well appearing. Will treat empirically with doxy for atypicals and CAP. Plan to follow up with PCP as needed and return precautions discussed for worsening or new concerning symptoms.   I personally performed the services described in this documentation, which was scribed in my presence. The recorded information has been reviewed and is accurate.    Leo Grosser, MD 01/27/16 [phone]

## 2016-01-26 NOTE — Discharge Instructions (Signed)

## 2016-01-26 NOTE — ED Notes (Signed)
Pt [facility] from [facility] for Chest [facility], recent removal of "BLood clot in heart" , today cp and lower right back pain

## 2016-01-26 NOTE — ED Notes (Signed)
Per pt report recently returned from [place] after having blood clots removed from chest , additionally receiving chem from [facility] [place].

## 2016-01-27 DIAGNOSIS — I2699 Other pulmonary embolism without acute cor pulmonale: Secondary | ICD-10-CM | POA: Insufficient documentation

## 2016-07-19 ENCOUNTER — Other Ambulatory Visit (INDEPENDENT_AMBULATORY_CARE_PROVIDER_SITE_OTHER): Payer: BLUE CROSS/BLUE SHIELD

## 2016-07-19 DIAGNOSIS — E039 Hypothyroidism, unspecified: Secondary | ICD-10-CM | POA: Diagnosis not present

## 2016-07-19 LAB — TSH: TSH: 56.14 u[IU]/mL — ABNORMAL HIGH (ref 0.35–4.50)

## 2016-07-19 LAB — T4, FREE: Free T4: 0.34 ng/dL — ABNORMAL LOW (ref 0.60–1.60)

## 2016-07-23 ENCOUNTER — Encounter: Payer: Self-pay | Admitting: Endocrinology

## 2016-07-23 ENCOUNTER — Ambulatory Visit (INDEPENDENT_AMBULATORY_CARE_PROVIDER_SITE_OTHER): Payer: BLUE CROSS/BLUE SHIELD | Admitting: Endocrinology

## 2016-07-23 VITALS — BP 110/62 | HR 75 | Temp 98.6°F | Resp 16 | Ht 65.0 in | Wt 195.4 lb

## 2016-07-23 DIAGNOSIS — E890 Postprocedural hypothyroidism: Secondary | ICD-10-CM

## 2016-07-23 MED ORDER — LEVOTHYROXINE SODIUM 175 MCG PO TABS: 175.0000 ug | ORAL_TABLET | Freq: Every day | ORAL | 2 refills | Status: DC

## 2016-07-23 NOTE — Progress Notes (Signed)
Patient ID: [name], female   DOB: Jun 29, 1976, 40 y.o.   MRN: [id number]   Reason for Appointment:  Hypothyroidism, followup visit   History of Present Illness:   HYPOTHYROIDISM was first diagnosed  after her I-131 treatment for Graves' disease in 2003 She had been on relatively large doses of thyroxine supplement up to 225 mcg and has been irregular with her followup   She was last seen in 03/2014 and she was then taking 150 g daily  She has not been seen in follow-up for over 2 years partly because of her being involved with treatment for breast cancer  Also because of her going through treatment and evaluation for the cancer she has been very negligent and taking her thyroid medication She thinks that the last 30 days she has probably taken 7 tablets only She tends to forget taking the medication in the morning   Not clear if she has had a TSH with her oncologist but they told her to go up to 175 g sometime ago  Although she does complain of some fatigue she thinks this is from various other factors and her busy schedule Has had some constipation from pain medications Does not complain of unusual cold intolerance or dry skin She has apparently gained weight recently   Wt Readings from Last 3 Encounters:  07/23/16 195 lb 6 oz (88.6 kg)  06/01/14 213 lb 12 oz (97 kg)  05/20/14 212 lb (96.2 kg)    Her TSH is now markedly increased  Lab Results  Component Value Date   TSH 56.14 Repeated and verified X2. (H) 07/19/2016   TSH 2.99 05/02/2014   TSH 3.98 03/28/2014   FREET4 0.34 (L) 07/19/2016   FREET4 0.76 03/28/2014   FREET4 0.69 07/01/2013         Medication List       Accurate as of 07/23/16  2:57 PM. Always use your most recent med list.          cephALEXin 500 MG capsule Commonly known as:  KEFLEX Take 500 mg by mouth 4 (four) times daily.   cholecalciferol 400 units Tabs tablet Commonly known as:  VITAMIN D Take 400 Units by mouth  daily.   levothyroxine 175 MCG tablet Commonly known as:  SYNTHROID, LEVOTHROID Take 175 mcg by mouth daily before breakfast.   LORazepam 0.5 MG tablet Commonly known as:  ATIVAN Take 0.5 mg by mouth every 6 (six) hours as needed for anxiety.   rivaroxaban 20 MG Tabs tablet Commonly known as:  XARELTO Take 20 mg by mouth daily.   WOMENS MULTIVITAMIN Tabs Take by mouth daily.       Allergies:  Allergies  Allergen Reactions  . Vicodin [Hydrocodone-Acetaminophen] Nausea And Vomiting    Past Medical History:  Diagnosis Date  . Breast cancer ([place])   . H/O echocardiogram 02/20/2007, 06/03/2002   normal transthoracic echocardiogram  . Hypothyroid   . Varicose veins     Past Surgical History:  Procedure Laterality Date  . VEIN LIGATION AND STRIPPING      Family History  Problem Relation Age of Onset  . Diabetes type I Father   . Diabetes type II Sister   . Hypertension Sister   . Diabetes Sister   . Kidney failure Mother   . Kidney disease Maternal Grandmother   . Diabetes Paternal Grandmother     Social History:  reports that she has never smoked. She does not have any smokeless  tobacco history on file. She reports that she does not drink alcohol or use drugs.  REVIEW Of SYSTEMS:     Examination:   BP 110/62 (Patient Position: Sitting, Cuff Size: Large)   Pulse 75   Temp 98.6 F (37 C) (Oral)   Resp 16   Ht 5\' 5"  (1.651 m)   Wt 195 lb 6 oz (88.6 kg)   SpO2 99%   BMI 32.51 kg/m   She has some puffiness of the face. Voice is relatively hoarse. Skin appears normal, normal swelling of her hands or feet Thyroid exam normal Difficult to elicit reflexes but somewhat slow at the brachioradialis    Assessment/Plan:   Hypothyroidism, post ablative.  She has been quite noncompliant with taking her thyroid medication lately She is profoundly hypothyroid with weight gain, mild myxedematous appearance and TSH of 54  For now she will try to take her  Synthroid at bedtime when she can remember it better with her Xarelto However not clear what her dosage requirement is and she can start off with her 175 g dose for now  [facility] 07/23/2016, 2:57 PM

## 2016-07-29 ENCOUNTER — Telehealth: Payer: Self-pay | Admitting: Endocrinology

## 2016-07-29 NOTE — Telephone Encounter (Signed)
[facility] ask if patient medication be changed from levothyroxine

## 2016-08-05 ENCOUNTER — Telehealth: Payer: Self-pay

## 2016-08-05 NOTE — Telephone Encounter (Signed)
Prefer Unithroid if they can get it

## 2016-08-05 NOTE — Telephone Encounter (Signed)
Pharmacy sent a fax stating they can no longer order [facility]oz Brand Levothyroxine. They are requesting a new rx that states it is ok to change the brand.

## 2016-08-08 NOTE — Telephone Encounter (Signed)
Issue resolved.

## 2016-08-08 NOTE — Telephone Encounter (Signed)
That will be fine. 

## 2016-08-21 ENCOUNTER — Other Ambulatory Visit: Payer: No Typology Code available for payment source

## 2016-10-18 ENCOUNTER — Other Ambulatory Visit: Payer: No Typology Code available for payment source

## 2016-10-23 ENCOUNTER — Ambulatory Visit: Payer: No Typology Code available for payment source | Admitting: Endocrinology

## 2017-05-12 ENCOUNTER — Ambulatory Visit (INDEPENDENT_AMBULATORY_CARE_PROVIDER_SITE_OTHER): Payer: BLUE CROSS/BLUE SHIELD | Admitting: Neurology

## 2017-05-12 ENCOUNTER — Encounter: Payer: Self-pay | Admitting: Neurology

## 2017-05-12 VITALS — BP 105/63 | HR 83 | Resp 18 | Ht 65.0 in | Wt 198.5 lb

## 2017-05-12 DIAGNOSIS — G35 Multiple sclerosis: Secondary | ICD-10-CM | POA: Insufficient documentation

## 2017-05-12 DIAGNOSIS — Z79899 Other long term (current) drug therapy: Secondary | ICD-10-CM | POA: Diagnosis not present

## 2017-05-12 DIAGNOSIS — C50412 Malignant neoplasm of upper-outer quadrant of left female breast: Secondary | ICD-10-CM | POA: Diagnosis not present

## 2017-05-12 NOTE — Progress Notes (Signed)
[facility]  PATIENT: [name] DOB: 1975-09-04  REFERRING DOCTOR OR PCP:  Ward Givens SOURCE: patient, notes from PCP/endocrinology/hemonc, labs and imaging reports  _________________________________   HISTORICAL  CHIEF COMPLAINT:  Chief Complaint  Patient presents with  . Multiple Sclerosis    [name] is here to reestablish care of MS with Dr. Felecia Shelling.  Sts. she was dx. in Aug. 2014,  Presenting sx. was lightheadedness, gait disturbance, right sided numbness.  MRI done at [facility].  No LP. Initially saw [facility].  Transferred care to Dr. Felecia Shelling. She started Gilenya around Nov. 2014.  She stopped a few mos. later when she became pregnant.  Never started another dmt. Has not had another MRI.  Occasional tingling left side of her face, right sided numbness.  Has also had breast ca/left   . Numbness    mastectomy plus chemo. Done with IV chemo, now taking oral chemo (Nerlynx)./fim    HISTORY OF PRESENT ILLNESS:  I had the pleasure seeing you patient, [name], at the [place] [facility] at [facility] for neurologic consultation regarding her multiple sclerosis. I have previously seen her at [facility] neurology, time in late 2014.     She was diagnosed with MS in August 2014 after presenting with vertigo followed a few days later by gait ataxia and right sided numbness.  MRI of the cervical spine 04/26/2013 showed an enhancing lesion to the right at C4. MRI of the brain 05/06/2013 showed lesions consistent with MS including a  right frontal lobe lesion that enhanced. Foci were also noted in   middle cerebellar peduncles. Showed multiple    MRI 05/14/2013 at [facility] showed more changes c/w MS.   No LP was needed.   She did Rehab and I started to see her in October 2014.     She started Gilenya 06/2013 and stopped 12/2013 for pregnancy.   She delivered a premature baby (28 weeks).    Her baby has done well.      She was diagnosed with  breast cancer 06/2015 and she had chemotherapy (Hceptin, Taxotere, Carboplatin, Perjeta), mastectomy and reconstructive surgery (left).   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   Since her initial exacerbation in 2014, she has had no other exacerbation.    She is being maintained on Nerlyn.   Her Oncologist is Dr. Kasandra Knudsen ([facility] [place]  [phone])  She has not had any exacerbations since her presenting severe symptoms in 2014. She feels she has made a near complete recovery.   She denies any difficulty with gait. There is no problems with strength or coordination. She does occasionally have left facial tingling and tingling in her feet. The foot tingling is mildly uncomfortable but not bad enough to go on a medication long-term.  She has never had any vision related issues with MS. Specifically she has not had optic neuritis or diplopia. She has mild bladder urgency and frequency but no incontinence.  She notes mild fatigue but is able to do any activity that she wants to. She sleeps well most nights but will sometimes take a lorazepam if she has insomnia. This only occurs a few times a month. She denies any difficulties with depression or anxiety. Cognition is fine.   REVIEW OF SYSTEMS: Constitutional: No fevers, chills, sweats, or change in appetite Eyes: No visual changes, double vision, eye pain Ear,  nose and throat: No hearing loss, ear pain, nasal congestion, sore throat Cardiovascular: No chest pain, palpitations Respiratory: No shortness of breath at rest or with exertion.   No wheezes GastrointestinaI: No nausea, vomiting, diarrhea, abdominal pain, fecal incontinence Genitourinary: No dysuria, urinary retention or frequency.  No nocturia. Musculoskeletal: No neck pain, back pain Integumentary: No rash, pruritus, skin lesions Neurological: as above Psychiatric: No depression at this time.  No  anxiety Endocrine:  she had iodine ablation of her thyroid due to thyroiditis and hypothyroidism. Hematologic/Lymphatic: No anemia, purpura, petechiae.  She had breast cancer  Allergic/Immunologic: No itchy/runny eyes, nasal congestion, recent allergic reactions, rashes  ALLERGIES: Allergies  Allergen Reactions  . Tape   . Vicodin [Hydrocodone-Acetaminophen] Nausea And Vomiting    HOME MEDICATIONS:  Current Outpatient Prescriptions:  .  cholecalciferol (VITAMIN D) 400 units TABS tablet, Take 400 Units by mouth daily., Disp: , Rfl:  .  ELIQUIS 5 MG TABS tablet, , Disp: , Rfl:  .  levothyroxine (SYNTHROID, LEVOTHROID) 175 MCG tablet, Take 1 tablet (175 mcg total) by mouth daily before breakfast., Disp: 30 tablet, Rfl: 2 .  LORazepam (ATIVAN) 0.5 MG tablet, Take 0.5 mg by mouth every 6 (six) hours as needed for anxiety., Disp: , Rfl:  .  Multiple Vitamins-Minerals (WOMENS MULTIVITAMIN) TABS, Take by mouth daily., Disp: , Rfl:   PAST MEDICAL HISTORY: Past Medical History:  Diagnosis Date  . Breast cancer ([place])   . H/O echocardiogram 02/20/2007, 06/03/2002   normal transthoracic echocardiogram  . Hypothyroid   . Varicose veins     PAST SURGICAL HISTORY: Past Surgical History:  Procedure Laterality Date  . VEIN LIGATION AND STRIPPING      FAMILY HISTORY: Family History  Problem Relation Age of Onset  . Diabetes type I Father   . Diabetes type II Sister   . Hypertension Sister   . Diabetes Sister   . Kidney failure Mother   . Kidney disease Maternal Grandmother   . Diabetes Paternal Grandmother     SOCIAL HISTORY:  Social History   Social History  . Marital status: Married    Spouse name: N/A  . Number of children: 2  . Years of education: N/A   Occupational History  . cna    Social History Main Topics  . Smoking status: Never Smoker  . Smokeless tobacco: Never Used  . Alcohol use No  . Drug use: No  . Sexual activity: Not on file   Other Topics Concern   . Not on file   Social History Narrative  . No narrative on file     PHYSICAL EXAM  Vitals:   05/12/17 1330  BP: 105/63  Pulse: 83  Resp: 18  Weight: 198 lb 8 oz (90 kg)  Height: 5\' 5"  (1.651 m)    Body mass index is 33.03 kg/m.   General: The patient is well-developed and well-nourished and in no acute distress  Eyes:  Funduscopic exam shows normal optic discs and retinal vessels.  Neck: The neck is supple, no carotid bruits are noted.  The neck is nontender.  Cardiovascular: The heart has a regular rate and rhythm with a normal S1 and S2. There were no murmurs, gallops or rubs. Lungs are clear to auscultation.  Skin: Extremities are without significant edema.  Musculoskeletal:  Back is nontender  Neurologic Exam  Mental status: The patient is alert and oriented x 3 at the time of the examination. The patient has apparent normal recent and remote memory,  with an apparently normal attention span and concentration ability.   Speech is normal.  Cranial nerves: Extraocular movements are full. Pupils are equal, round, and reactive to light and accomodation.  Visual fields are full.  Facial symmetry is present. There is good facial sensation to soft touch bilaterally.Facial strength is normal.  Trapezius and sternocleidomastoid strength is normal. No dysarthria is noted.  The tongue is midline, and the patient has symmetric elevation of the soft palate. No obvious hearing deficits are noted.  Motor:  Muscle bulk is normal.   Tone is normal. Strength is  5 / 5 in all 4 extremities.   Sensory: Sensory testing is intact to pinprick, soft touch and vibration sensation in all 4 extremities.  Coordination: Cerebellar testing reveals good finger-nose-finger and heel-to-shin bilaterally.  Gait and station: Station is normal.   Gait is normal. Tandem gait is normal. Romberg is negative.   Reflexes: Deep tendon reflexes are symmetric and normal bilaterally.   Plantar responses are  flexor.    DIAGNOSTIC DATA (LABS, IMAGING, TESTING) - I reviewed patient records, labs, notes, testing and imaging myself where available.  Lab Results  Component Value Date   WBC 4.7 01/26/2016   HGB 9.1 (L) 01/26/2016   HCT 27.6 (L) 01/26/2016   MCV 104.2 (H) 01/26/2016   PLT 312 01/26/2016      Component Value Date/Time   NA 138 01/26/2016 1830   K 3.6 01/26/2016 1830   CL 107 01/26/2016 1830   CO2 25 01/26/2016 1830   GLUCOSE 94 01/26/2016 1830   BUN 10 01/26/2016 1830   CREATININE 0.94 01/26/2016 1830   CALCIUM 9.8 01/26/2016 1830   PROT 8.1 12/22/2011 1948   ALBUMIN 3.2 (L) 05/02/2014 1406   AST 21 12/22/2011 1948   ALT 13 12/22/2011 1948   ALKPHOS 55 12/22/2011 1948   BILITOT 0.4 12/22/2011 1948   GFRNONAA >60 01/26/2016 1830   GFRAA >60 01/26/2016 1830   No results found for: CHOL, HDL, LDLCALC, LDLDIRECT, TRIG, CHOLHDL No results found for: HGBA1C No results found for: VITAMINB12 Lab Results  Component Value Date   TSH 56.14 Repeated and verified X2. (H) 07/19/2016       ASSESSMENT AND PLAN  Multiple sclerosis ([place]) - Plan: MR BRAIN W WO CONTRAST, CBC with Differential/Platelet, Comprehensive metabolic panel, Thyroid Panel With TSH, Urinalysis  Malignant neoplasm of upper-outer quadrant of left female breast, unspecified estrogen receptor status (HCC)  High risk medication use - Plan: CBC with Differential/Platelet, Comprehensive metabolic panel, Thyroid Panel With TSH, Urinalysis    In summary, [name] is a 41 year old woman diagnosed with MS in 2014 who was on Gilenya for 5-6 months but stopped due to pregnancy and never went back on therapy as she developed breast cancer shortly after delivering.   She is now ready to get back on a therapy. We need to check an MRI of the brain with and without contrast to better determine the aggressiveness of her MS. If we can get the old images I will compare them side by side. If there is a fair amount of  subclinical progression she will definitely need to go on a higher efficacy medication such as Lemtrada, ocrelizumab or Tysabri. We discussed treatment options and she is most interested in [place] as she feels she would not be able to do a self injectable medication and is concerned about compliance and GI issues if she goes on an oral agent.   We discussed the risk of autoimmune adverse  events such as ITP and anti-glomerular basement membrane disease.  We also discussed the more common issues with infusion reactions that are usually self-limiting but can be more significant. The possibility of autoimmune thyroiditis is less of an issue as she has had an ablation. I discussed with her that I would need to run the medication by her oncologist to make sure that there is not a contraindication from that perspective. She understands that she would need to have monthly monitoring for 4 years after her second dose and is willing to comply.   She will return to see me for her infusion or sooner if there are new or worsening neurologic symptoms. We will let her know what the results of the MRI are.  Thank you for asking me to see Ms. Klatt. Please let me know if I can be of further assistance with her or other patients in the future.      Alik Mawson A. Felecia Shelling, MD, [facility] 4/65/0354, 6:56 PM Certified in Neurology, Clinical Neurophysiology, Sleep Medicine, Pain Medicine and Neuroimaging  [facility] [address] [place], [place] [address] [phone]

## 2017-05-13 ENCOUNTER — Telehealth: Payer: Self-pay | Admitting: Neurology

## 2017-05-13 LAB — COMPREHENSIVE METABOLIC PANEL
ALT: 9 IU/L (ref 0–32)
AST: 22 IU/L (ref 0–40)
Albumin/Globulin Ratio: 1.4 (ref 1.2–2.2)
Albumin: 4.6 g/dL (ref 3.5–5.5)
Alkaline Phosphatase: 54 IU/L (ref 39–117)
BUN/Creatinine Ratio: 7 — ABNORMAL LOW (ref 9–23)
BUN: 9 mg/dL (ref 6–24)
Bilirubin Total: 0.3 mg/dL (ref 0.0–1.2)
CO2: 23 mmol/L (ref 20–29)
Calcium: 10.2 mg/dL (ref 8.7–10.2)
Chloride: 103 mmol/L (ref 96–106)
Creatinine, Ser: 1.22 mg/dL — ABNORMAL HIGH (ref 0.57–1.00)
GFR calc Af Amer: 63 mL/min/{1.73_m2} (ref >59)
GFR calc non Af Amer: 55 mL/min/{1.73_m2} — ABNORMAL LOW (ref >59)
Globulin, Total: 3.2 g/dL (ref 1.5–4.5)
Glucose: 96 mg/dL (ref 65–99)
Potassium: 3.7 mmol/L (ref 3.5–5.2)
Sodium: 142 mmol/L (ref 134–144)
Total Protein: 7.8 g/dL (ref 6.0–8.5)

## 2017-05-13 LAB — THYROID PANEL WITH TSH
Free Thyroxine Index: 0.6 — ABNORMAL LOW (ref 1.2–4.9)
T3 Uptake Ratio: 18 % — ABNORMAL LOW (ref 24–39)
T4, Total: 3.2 ug/dL — ABNORMAL LOW (ref 4.5–12.0)
TSH: 32.9 u[IU]/mL — ABNORMAL HIGH (ref 0.450–4.500)

## 2017-05-13 LAB — CBC WITH DIFFERENTIAL/PLATELET
Basophils Absolute: 0 10*3/uL (ref 0.0–0.2)
Basos: 0 %
EOS (ABSOLUTE): 0.1 10*3/uL (ref 0.0–0.4)
Eos: 1 %
Hematocrit: 34.6 % (ref 34.0–46.6)
Hemoglobin: 11.8 g/dL (ref 11.1–15.9)
Immature Grans (Abs): 0 10*3/uL (ref 0.0–0.1)
Immature Granulocytes: 0 %
Lymphocytes Absolute: 2 10*3/uL (ref 0.7–3.1)
Lymphs: 39 %
MCH: 30.4 pg (ref 26.6–33.0)
MCHC: 34.1 g/dL (ref 31.5–35.7)
MCV: 89 fL (ref 79–97)
Monocytes Absolute: 0.3 10*3/uL (ref 0.1–0.9)
Monocytes: 6 %
Neutrophils Absolute: 2.8 10*3/uL (ref 1.4–7.0)
Neutrophils: 54 %
Platelets: 259 10*3/uL (ref 150–379)
RBC: 3.88 x10E6/uL (ref 3.77–5.28)
RDW: 14.2 % (ref 12.3–15.4)
WBC: 5.3 10*3/uL (ref 3.4–10.8)

## 2017-05-13 LAB — URINALYSIS
Bilirubin, UA: NEGATIVE
Glucose, UA: NEGATIVE
Ketones, UA: NEGATIVE
Leukocytes, UA: NEGATIVE
Nitrite, UA: NEGATIVE
Protein, UA: NEGATIVE
RBC, UA: NEGATIVE
Specific Gravity, UA: 1.024 (ref 1.005–1.030)
Urobilinogen, Ur: 0.2 mg/dL (ref 0.2–1.0)
pH, UA: 5 (ref 5.0–7.5)

## 2017-05-13 NOTE — Telephone Encounter (Signed)
I spoke to [place] at the [facility] [place] in [place] who treated Ms. Niehoff alongside Dr. Kasandra Knudsen.  We discussed Lemtrada for her MS, specifically in reference to possible issues due to her breast cancer.   Due to the aggressiveness of Ms. Wolden cancer and small risk that an immunomodulatory agent like Lemtrada may increase the risk of recurrence, another option is recommended.  I tried to call Ms. Coppedge but got her voicemail and the mailbox was full.     I think Aubagio would be a better option for her and we can try to get her to stop by (or mail) the Aubagio start form.  I will try to call her later today.

## 2017-05-14 NOTE — Telephone Encounter (Signed)
I left a message that I had been able to contact her oncology service and we discussed MS medications. I will try to call her again later today

## 2017-05-15 NOTE — Telephone Encounter (Signed)
I discussed her MS disease modifying therapy. She was started Aubagio. We will mail her a form to sign

## 2017-05-30 ENCOUNTER — Encounter: Payer: Self-pay | Admitting: *Deleted

## 2017-06-05 ENCOUNTER — Telehealth: Payer: Self-pay | Admitting: *Deleted

## 2017-06-05 NOTE — Telephone Encounter (Signed)
Aubagio PA completed and faxed to [facility] [place], fax# [phone].  Tried and failed: Gilenya.  Contraindicated: Avonex, Copaxone, Rebif, Betaseron, Plegridy (unable to self inject), Tecfidera (gi problems)/fim

## 2017-06-06 NOTE — Telephone Encounter (Signed)
Fax received from [facility] that PA needs to be done thru [facility] phone# [phone].  PA completed by phone for Aubagio 14mg  #30/30.  Tried and failed: Gilenya.  Contraindicated: Avonex, Copaxone, Betaseron, Plegridy, Rebif (unable to self inject) and Tecfidera (GI issues).  Pa approved for dates 06/06/17 thru 06/06/18.  ref# [id number]

## 2017-12-01 IMAGING — CT CT ANGIO CHEST
2 of 9 series · 19 of 36 positions shown · IV contrast (isovue)
Comparison: None.

CLINICAL DATA: Right back pain, chest pain for 4 days

EXAM:
CT ANGIOGRAPHY CHEST WITH CONTRAST
TECHNIQUE: Multidetector CT imaging of the chest was performed using the
standard protocol during bolus administration of intravenous
contrast. Multiplanar CT image reconstructions and MIPs were
obtained to evaluate the vascular anatomy.
CONTRAST:  100 mL Isovue 370

[Series 7: pe thins · axial · 0.55mm/px · z∈[-268,-37]mm · 18 of 259 slices shown]
[im 14/259  lung]
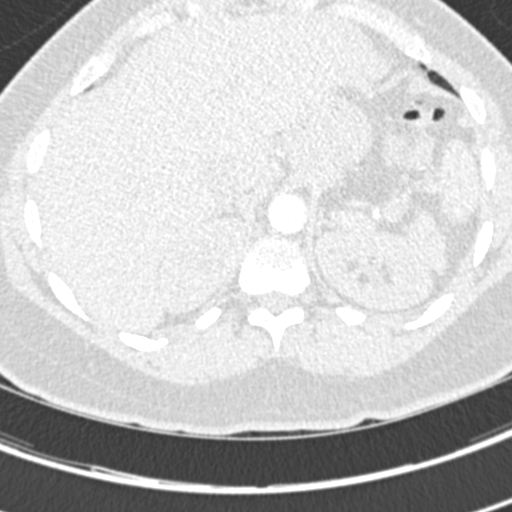
[im 28/259  mediastinal]
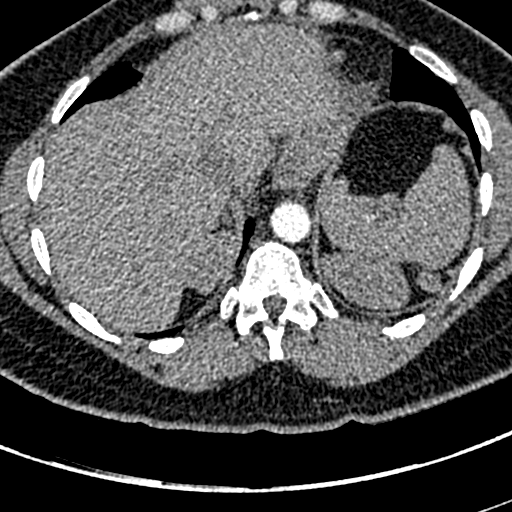
[im 41/259  lung]
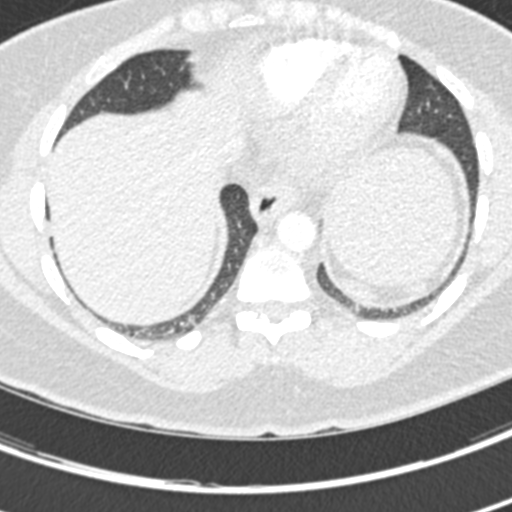
[im 55/259  mediastinal]
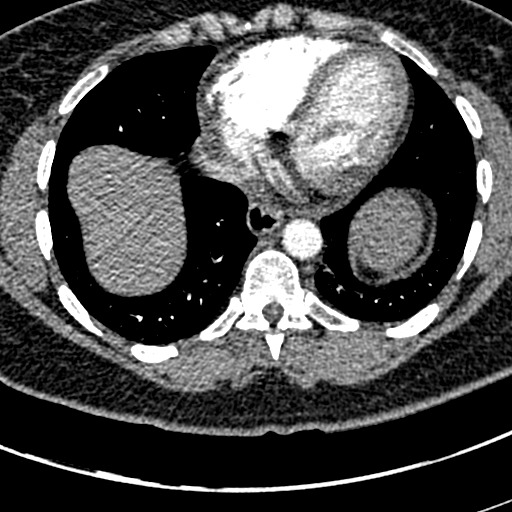
[im 68/259  lung]
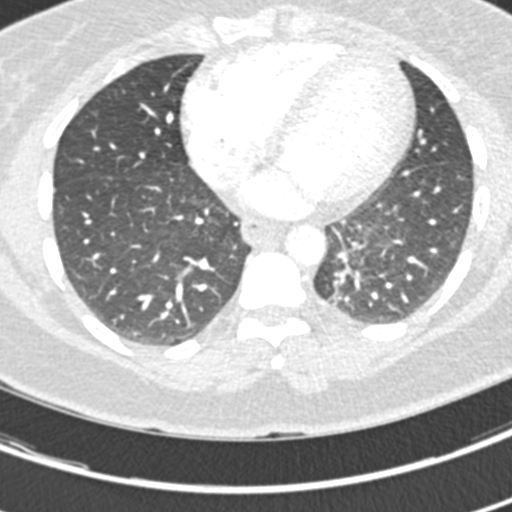
[im 82/259  mediastinal]
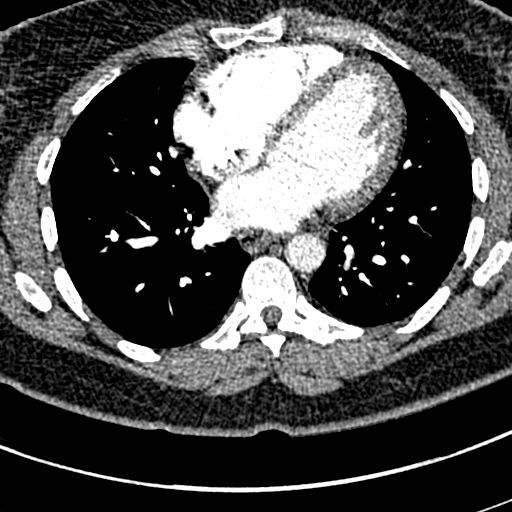
[im 96/259  lung]
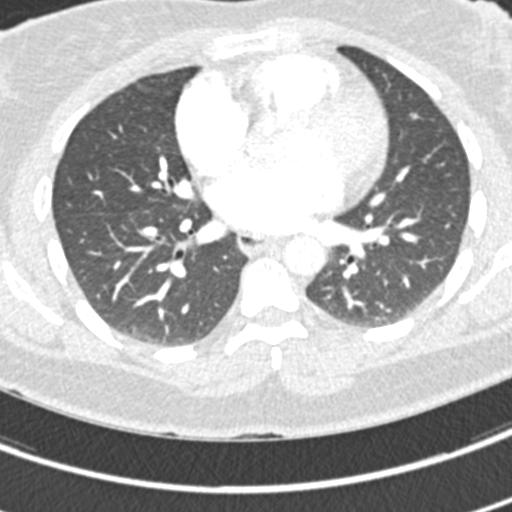
[im 109/259  mediastinal]
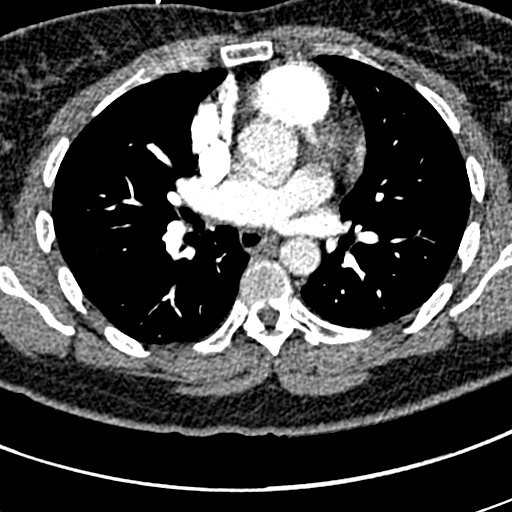
[im 123/259  lung]
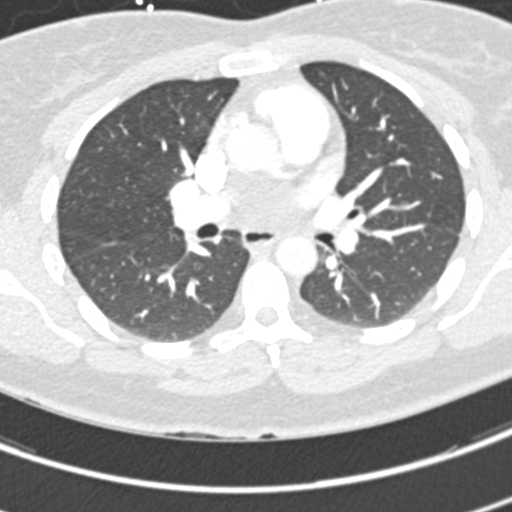
[im 136/259  mediastinal]
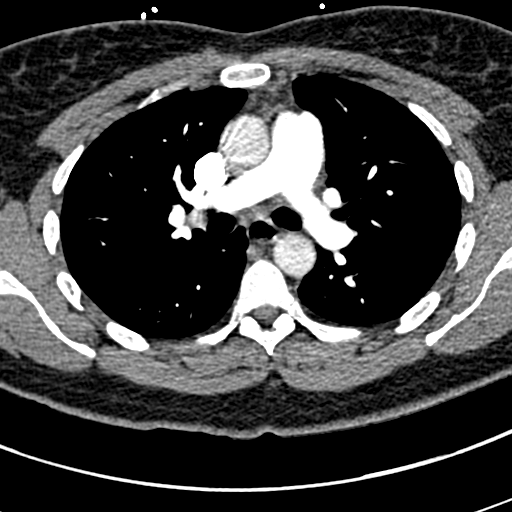
[im 150/259  lung]
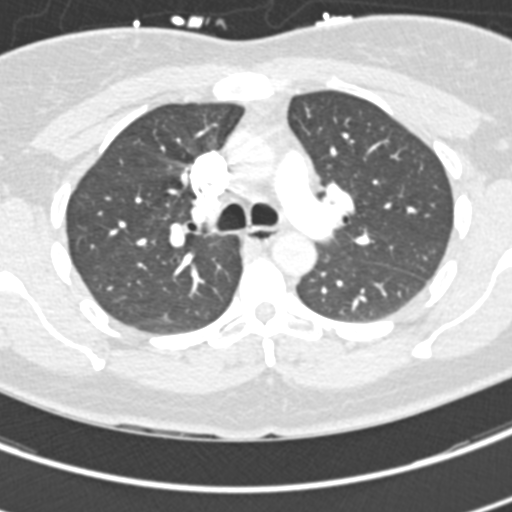
[im 163/259  mediastinal]
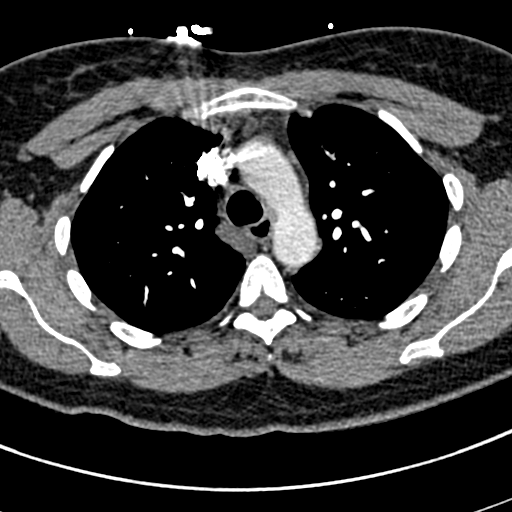
[im 177/259  lung]
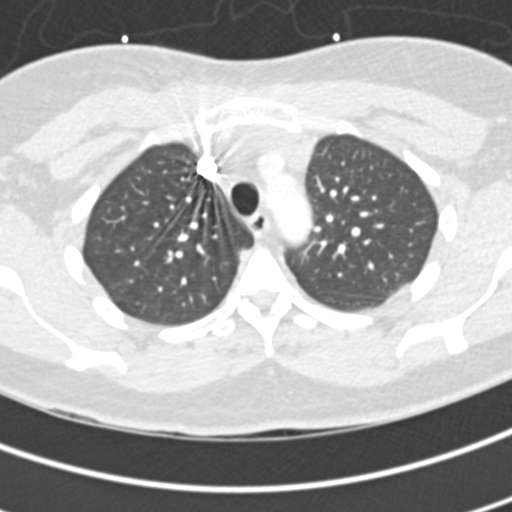
[im 191/259  mediastinal]
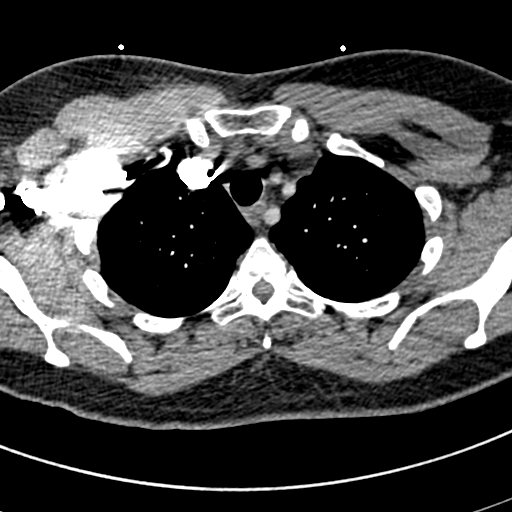
[im 204/259  lung]
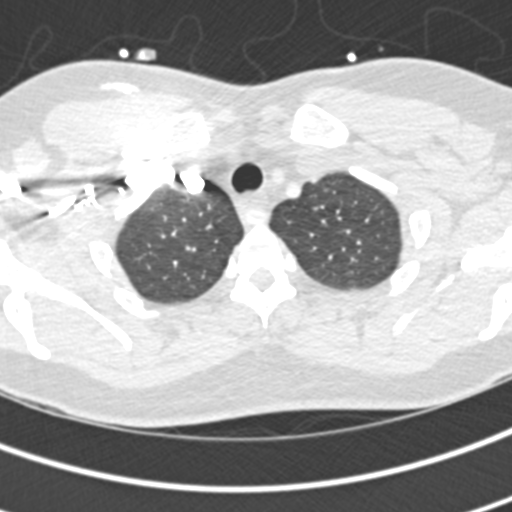
[im 218/259  mediastinal]
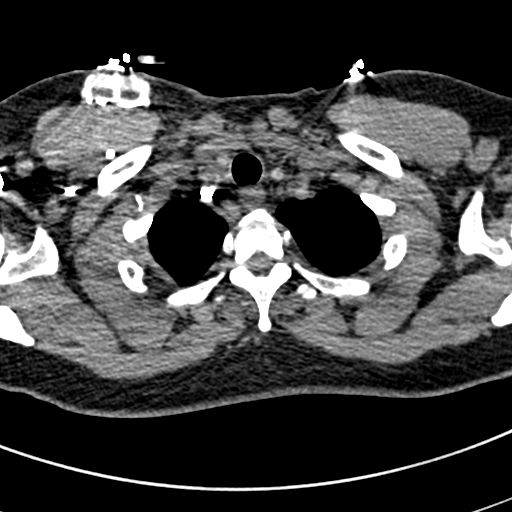
[im 231/259  lung]
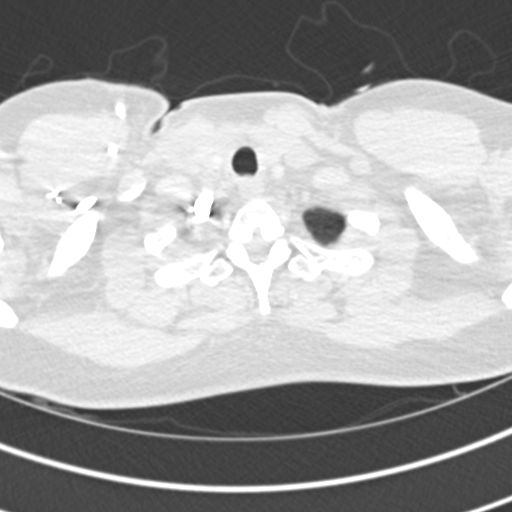
[im 245/259  mediastinal]
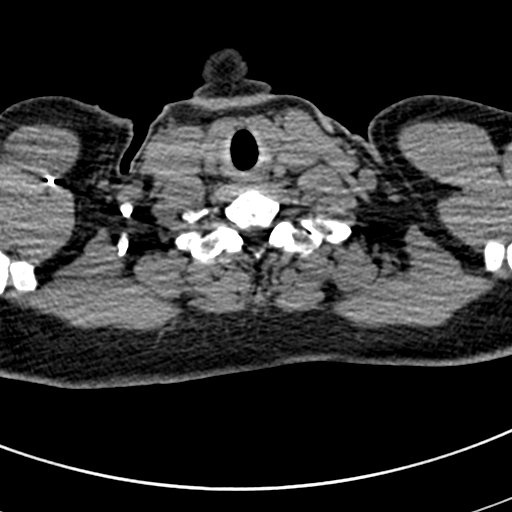

[Series 8: pe coronal mpr · coronal · 0.53mm/px · 1 of 116 slices shown]
[im 58/116  mediastinal]
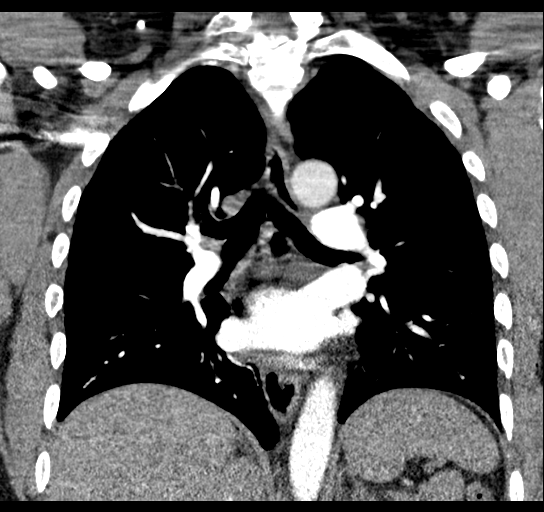

[19 of 36 positions shown; findings below may reference images not displayed]

FINDINGS: There is adequate opacification of the pulmonary arteries. There is
no pulmonary embolus. The main pulmonary artery, right main
pulmonary artery and left main pulmonary arteries are normal in
size. The heart size is normal. There is no pericardial effusion.

Small mild patchy ground-glass opacity in the right upper lobe. No
focal consolidation, pleural effusion or pneumothorax.

There is no axillary, hilar, or mediastinal adenopathy.

There is no lytic or blastic osseous lesion.

The visualized portions of the upper abdomen are unremarkable.

Review of the MIP images confirms the above findings.
IMPRESSION: 1. No evidence of pulmonary embolus.
2. Small mild patchy area of ground-glass opacity in the right upper
lobe which may be secondary to an infectious or inflammatory
etiology.

## 2018-09-03 ENCOUNTER — Telehealth: Payer: Self-pay | Admitting: Endocrinology

## 2018-09-03 ENCOUNTER — Other Ambulatory Visit: Payer: Self-pay

## 2018-09-03 MED ORDER — LEVOTHYROXINE SODIUM 175 MCG PO TABS: 175.0000 ug | ORAL_TABLET | Freq: Every day | ORAL | 2 refills | Status: DC

## 2018-09-03 NOTE — Telephone Encounter (Signed)
Rx sent 

## 2018-09-03 NOTE — Telephone Encounter (Signed)
MEDICATION: levothyroxine (SYNTHROID, LEVOTHROID) 175 MCG tablet  PHARMACY:  [facility] - [place], [place] - [address]  IS THIS A 90 DAY SUPPLY :   IS PATIENT OUT OF MEDICATION: yes  IF NOT; HOW MUCH IS LEFT:   LAST APPOINTMENT DATE: @Visit  date not found  NEXT APPOINTMENT DATE:@1 /17/2020  DO WE HAVE YOUR PERMISSION TO LEAVE A DETAILED MESSAGE:  OTHER COMMENTS:    **Let patient know to contact pharmacy at the end of the day to make sure medication is ready. **  ** Please notify patient to allow 48-72 hours to process**  **Encourage patient to contact the pharmacy for refills or they can request refills through [facility]**

## 2018-09-15 ENCOUNTER — Other Ambulatory Visit: Payer: Self-pay | Admitting: Endocrinology

## 2018-09-15 DIAGNOSIS — E890 Postprocedural hypothyroidism: Secondary | ICD-10-CM

## 2018-09-16 ENCOUNTER — Other Ambulatory Visit (INDEPENDENT_AMBULATORY_CARE_PROVIDER_SITE_OTHER): Payer: 59

## 2018-09-16 DIAGNOSIS — E890 Postprocedural hypothyroidism: Secondary | ICD-10-CM

## 2018-09-16 LAB — TSH: TSH: 16.39 u[IU]/mL — ABNORMAL HIGH (ref 0.35–4.50)

## 2018-09-16 LAB — T4, FREE: Free T4: 0.9 ng/dL (ref 0.60–1.60)

## 2018-09-18 ENCOUNTER — Other Ambulatory Visit: Payer: No Typology Code available for payment source

## 2018-09-21 ENCOUNTER — Encounter: Payer: Self-pay | Admitting: Endocrinology

## 2018-09-21 ENCOUNTER — Ambulatory Visit: Payer: No Typology Code available for payment source | Admitting: Endocrinology

## 2018-09-21 VITALS — BP 110/70 | HR 69 | Ht 65.0 in | Wt 211.2 lb

## 2018-09-21 DIAGNOSIS — E890 Postprocedural hypothyroidism: Secondary | ICD-10-CM | POA: Diagnosis not present

## 2018-09-21 MED ORDER — LEVOTHYROXINE SODIUM 200 MCG PO TABS: 200.0000 ug | ORAL_TABLET | Freq: Every day | ORAL | 1 refills | Status: DC

## 2018-09-21 NOTE — Progress Notes (Signed)
Patient ID: [name], female   DOB: 1976-01-17, 43 y.o.   MRN: [id number]   Reason for Appointment:  Hypothyroidism, followup visit   History of Present Illness:   HYPOTHYROIDISM was first diagnosed  after her I-131 treatment for Graves' disease in 2003 She had been on relatively large doses of thyroxine supplement up to 225 mcg and has been irregular with her followup   She was last seen in 07/2016 and since then she has been taking 175 g daily However she did not come back for follow-up to determine whether she was on an appropriate dose, previously had taken her supplement irregularly  She has not been seen in follow-up for over 2 years again despite reminders for her to be seen regularly She did get a prescription from her oncologist previously However over the last several months she is taking her medication irregularly  She thinks that only an the last 30 days she has been mostly taking her medication every day She is able to take it in the morning usually before breakfast and takes her Eliquis at the same time also Does not take any vitamins until later  States she has been much more tired than usual but with taking her medication she is a little better She is also concerned about her weight gain  She says she is trying to have smoothies for breakfast Also trying to walk when she can   Wt Readings from Last 3 Encounters:  09/21/18 211 lb 3.2 oz (95.8 kg)  05/12/17 198 lb 8 oz (90 kg)  07/23/16 195 lb 6 oz (88.6 kg)    Her TSH is now markedly increased  Lab Results  Component Value Date   TSH 16.39 (H) 09/16/2018   TSH 32.900 (H) 05/12/2017   TSH 56.14 Repeated and verified X2. (H) 07/19/2016   FREET4 0.90 09/16/2018   FREET4 0.34 (L) 07/19/2016   FREET4 0.76 03/28/2014       Allergies as of 09/21/2018      Reactions   Tape    Vicodin [hydrocodone-acetaminophen] Nausea And Vomiting      Medication List       Accurate as of September 21, 2018  9:03 AM. Always use your most recent med list.        D3 HIGH POTENCY 125 MCG (5000 UT) capsule Generic drug:  Cholecalciferol Take 5,000 Units by mouth daily. TAKE 1 TABLET BY MOUTH ONCE DAILY.   ELIQUIS 5 MG Tabs tablet Generic drug:  apixaban 5 mg. TAKE 1 TABLET BY MOUTH TWICE DAILY.   levothyroxine 175 MCG tablet Commonly known as:  SYNTHROID, LEVOTHROID Take 1 tablet (175 mcg total) by mouth daily before breakfast.       Allergies:  Allergies  Allergen Reactions  . Tape   . Vicodin [Hydrocodone-Acetaminophen] Nausea And Vomiting    Past Medical History:  Diagnosis Date  . Breast cancer ([place])   . H/O echocardiogram 02/20/2007, 06/03/2002   normal transthoracic echocardiogram  . Hypothyroid   . Varicose veins     Past Surgical History:  Procedure Laterality Date  . VEIN LIGATION AND STRIPPING      Family History  Problem Relation Age of Onset  . Diabetes type I Father   . Diabetes type II Sister   . Hypertension Sister   . Diabetes Sister   . Kidney failure Mother   . Kidney disease Maternal Grandmother   . Diabetes Paternal Grandmother     Social  History:  reports that she has never smoked. She has never used smokeless tobacco. She reports that she does not drink alcohol or use drugs.  REVIEW Of SYSTEMS:     Examination:   BP 110/70 (BP Location: Left Arm, Patient Position: Sitting, Cuff Size: Normal)   Pulse 69   Ht 5\' 5"  (1.651 m)   Wt 211 lb 3.2 oz (95.8 kg)   SpO2 98%   BMI 35.15 kg/m   She has some puffiness of the face.  Thyroid exam normal Difficult to elicit reflexes but appear normal    Assessment/Plan:   Hypothyroidism, post ablative.  As before she has not taken her levothyroxine regularly over the last few months Only recently has gone back on taking her 175 mcg levothyroxine but TSH is still high at 16  Most likely she needs to be on 200 mcg daily and will increase the dose  Weight gain: This is likely to be from  inadequate exercise and persistent hypothyroidism Discussed at weight loss medications are only good for short-term benefit and will need to first get her thyroid back to normal consistently Also she can do better with increasing exercise regimen May consider consultation with dietitian She will need more regular follow-up and have her come back in 2 months with labs  Elayne Snare 09/21/2018, 9:03 AM

## 2019-04-14 ENCOUNTER — Telehealth: Payer: Self-pay | Admitting: Endocrinology

## 2019-04-14 ENCOUNTER — Other Ambulatory Visit: Payer: Self-pay

## 2019-04-14 MED ORDER — LEVOTHYROXINE SODIUM 200 MCG PO TABS: 200.0000 ug | ORAL_TABLET | Freq: Every day | ORAL | 0 refills | Status: DC

## 2019-04-14 NOTE — Telephone Encounter (Signed)
MEDICATION: Levothyroxine  PHARMACY:  [facility] on [address] in [place] :   IS PATIENT OUT OF MEDICATION:   IF NOT; HOW MUCH IS LEFT:   LAST APPOINTMENT DATE: @01 /20/2020  NEXT APPOINTMENT DATE:@8 /21/2020  DO WE HAVE YOUR PERMISSION TO LEAVE A DETAILED MESSAGE: YES  OTHER COMMENTS:    **Let patient know to contact pharmacy at the end of the day to make sure medication is ready. **  ** Please notify patient to allow 48-72 hours to process**  **Encourage patient to contact the pharmacy for refills or they can request refills through [facility]**

## 2019-04-14 NOTE — Telephone Encounter (Signed)
Rx sent for 15 day supply until pt can be seen.

## 2019-04-23 ENCOUNTER — Other Ambulatory Visit: Payer: Self-pay

## 2019-04-23 ENCOUNTER — Other Ambulatory Visit (INDEPENDENT_AMBULATORY_CARE_PROVIDER_SITE_OTHER): Payer: 59

## 2019-04-23 DIAGNOSIS — E890 Postprocedural hypothyroidism: Secondary | ICD-10-CM

## 2019-04-23 LAB — TSH: TSH: 2.32 u[IU]/mL (ref 0.35–4.50)

## 2019-04-23 LAB — T4, FREE: Free T4: 1 ng/dL (ref 0.60–1.60)

## 2019-04-27 ENCOUNTER — Ambulatory Visit (INDEPENDENT_AMBULATORY_CARE_PROVIDER_SITE_OTHER): Payer: 59 | Admitting: Endocrinology

## 2019-04-27 ENCOUNTER — Encounter: Payer: Self-pay | Admitting: Endocrinology

## 2019-04-27 ENCOUNTER — Other Ambulatory Visit: Payer: Self-pay

## 2019-04-27 DIAGNOSIS — E890 Postprocedural hypothyroidism: Secondary | ICD-10-CM | POA: Diagnosis not present

## 2019-04-27 MED ORDER — LEVOTHYROXINE SODIUM 200 MCG PO TABS: 200.0000 ug | ORAL_TABLET | Freq: Every day | ORAL | 1 refills | Status: DC

## 2019-04-27 NOTE — Progress Notes (Signed)
Patient ID: [name], female   DOB: 1976-07-15, 43 y.o.   MRN: [id number]  Today's office visit was provided via telemedicine using video technique The patient was explained the limitations of evaluation and management by telemedicine and the availability of in person appointments.  The patient understood the limitations and agreed to proceed. Patient also understood that the telehealth visit is billable. . Location of the patient: Patient's home . Location of the provider: Physician office Only the patient and myself were participating in the encounter    Reason for Appointment:  Hypothyroidism, followup visit   History of Present Illness:   HYPOTHYROIDISM was first diagnosed  after her I-131 treatment for Graves' disease in 2003 She had been on relatively large doses of thyroxine supplement up to 225 mcg and has been irregular with her followup   She was last seen in 09/2018 and since then she has been taking 200 g daily Although she was trying to take her supplement more regularly prior to her last visit her TSH was still high at 16.4 She had also gained about 13 pounds at that time  More recently has had no unusual fatigue and she thinks she is functioning well, does tend to have mild fatigue chronically Her weight has leveled off She says she can do better with exercise and has not focused on her weight loss  She has a routine of taking her thyroid pill an hour before breakfast and she is generally very regular with this  Her TSH is now back to normal   Wt Readings from Last 3 Encounters:  09/21/18 211 lb 3.2 oz (95.8 kg)  05/12/17 198 lb 8 oz (90 kg)  07/23/16 195 lb 6 oz (88.6 kg)     Lab Results  Component Value Date   TSH 2.32 04/23/2019   TSH 16.39 (H) 09/16/2018   TSH 32.900 (H) 05/12/2017   FREET4 1.00 04/23/2019   FREET4 0.90 09/16/2018   FREET4 0.34 (L) 07/19/2016       Allergies as of 04/27/2019      Reactions   Tape    Vicodin  [hydrocodone-acetaminophen] Nausea And Vomiting      Medication List       Accurate as of April 27, 2019  2:45 PM. If you have any questions, ask your nurse or doctor.        D3 High Potency 125 MCG (5000 UT) capsule Generic drug: Cholecalciferol Take 5,000 Units by mouth daily. TAKE 1 TABLET BY MOUTH ONCE DAILY.   Eliquis 5 MG Tabs tablet Generic drug: apixaban 5 mg. TAKE 1 TABLET BY MOUTH TWICE DAILY.   levothyroxine 200 MCG tablet Commonly known as: Synthroid Take 1 tablet (200 mcg total) by mouth daily before breakfast.       Allergies:  Allergies  Allergen Reactions  . Tape   . Vicodin [Hydrocodone-Acetaminophen] Nausea And Vomiting    Past Medical History:  Diagnosis Date  . Breast cancer ([place])   . H/O echocardiogram 02/20/2007, 06/03/2002   normal transthoracic echocardiogram  . Hypothyroid   . Varicose veins     Past Surgical History:  Procedure Laterality Date  . VEIN LIGATION AND STRIPPING      Family History  Problem Relation Age of Onset  . Diabetes type I Father   . Diabetes type II Sister   . Hypertension Sister   . Diabetes Sister   . Kidney failure Mother   . Kidney disease Maternal Grandmother   .  Diabetes Paternal Grandmother     Social History:  reports that she has never smoked. She has never used smokeless tobacco. She reports that she does not drink alcohol or use drugs.  REVIEW Of SYSTEMS:  She is on long-term Eliquis for history of pulmonary embolism   Examination:   There were no vitals taken for this visit.      Assessment/Plan:   Hypothyroidism, post ablative.  She is finally getting compliant with her levothyroxine supplements on a daily basis Her dose was increased in January when her TSH was still relatively high  Subjectively she has felt better with the dosage increase Discussed timing of her medication, taking it without any supplements like iron or calcium with a glass of water  Also reminded her to be  regular with her follow-up and will need to see her back in 6 months to make sure her dosage is consistent  Elayne Snare 04/27/2019, 2:45 PM

## 2020-01-07 ENCOUNTER — Other Ambulatory Visit: Payer: Self-pay

## 2020-01-10 ENCOUNTER — Encounter: Payer: Self-pay | Admitting: Endocrinology

## 2020-01-10 ENCOUNTER — Other Ambulatory Visit: Payer: Self-pay

## 2020-01-10 ENCOUNTER — Ambulatory Visit (INDEPENDENT_AMBULATORY_CARE_PROVIDER_SITE_OTHER): Payer: 59 | Admitting: Endocrinology

## 2020-01-10 VITALS — BP 96/70 | HR 76 | Temp 99.1°F | Wt 207.0 lb

## 2020-01-10 DIAGNOSIS — E890 Postprocedural hypothyroidism: Secondary | ICD-10-CM

## 2020-01-10 MED ORDER — LEVOTHYROXINE SODIUM 200 MCG PO TABS: 200.0000 ug | ORAL_TABLET | Freq: Every day | ORAL | 1 refills | Status: DC

## 2020-01-10 NOTE — Progress Notes (Signed)
Patient ID: [name], female   DOB: [birth year], 44 y.o.   MRN: [id number]   Reason for Appointment:  Hypothyroidism, followup visit   History of Present Illness:   HYPOTHYROIDISM was first diagnosed  after her I-131 treatment for Graves' disease in 2003 She had been on relatively large doses of thyroxine supplement up to 225 mcg and has been irregular with her followup   She was last seen in 04/2019 Since 09/2018 she has been taking 200 g daily of levothyroxine  She has had better consistency of taking her levothyroxine daily without forgetting it and did not date a change in her dosage on her last visit Again she feels fairly good and does not think she has unusual fatigue Her weight is slightly better than last year  She did not schedule labs and since she ran out of her levothyroxine prescription on Friday has not taken it for the last 3 mornings  She has a routine of taking her thyroid pill an hour before breakfast without any food, milk or calcium/iron supplements  Her TSH is now pending   Wt Readings from Last 3 Encounters:  01/10/20 207 lb (93.9 kg)  09/21/18 211 lb 3.2 oz (95.8 kg)  05/12/17 198 lb 8 oz (90 kg)     Lab Results  Component Value Date   TSH 2.32 04/23/2019   TSH 16.39 (H) 09/16/2018   TSH 32.900 (H) 05/12/2017   FREET4 1.00 04/23/2019   FREET4 0.90 09/16/2018   FREET4 0.34 (L) 07/19/2016       Allergies as of 01/10/2020      Reactions   Tape    Vicodin [hydrocodone-acetaminophen] Nausea And Vomiting      Medication List       Accurate as of Jan 10, 2020  4:25 PM. If you have any questions, ask your nurse or doctor.        D3 High Potency 125 MCG (5000 UT) capsule Generic drug: Cholecalciferol Take 5,000 Units by mouth daily. TAKE 1 TABLET BY MOUTH ONCE DAILY.   Eliquis 5 MG Tabs tablet Generic drug: apixaban 5 mg. TAKE 1 TABLET BY MOUTH TWICE DAILY.   levothyroxine 200 MCG tablet Commonly known as: Synthroid Take  1 tablet (200 mcg total) by mouth daily before breakfast.       Allergies:  Allergies  Allergen Reactions  . Tape   . Vicodin [Hydrocodone-Acetaminophen] Nausea And Vomiting    Past Medical History:  Diagnosis Date  . Breast cancer ([place])   . H/O echocardiogram 02/20/2007, 06/03/2002   normal transthoracic echocardiogram  . Hypothyroid   . Varicose veins     Past Surgical History:  Procedure Laterality Date  . VEIN LIGATION AND STRIPPING      Family History  Problem Relation Age of Onset  . Diabetes type I Father   . Diabetes type II Sister   . Hypertension Sister   . Diabetes Sister   . Kidney failure Mother   . Kidney disease Maternal Grandmother   . Diabetes Paternal Grandmother     Social History:  reports that she has never smoked. She has never used smokeless tobacco. She reports that she does not drink alcohol or use drugs.  REVIEW Of SYSTEMS:  She is on long-term Eliquis for history of pulmonary embolism   Examination:   BP 96/70 (BP Location: Left Arm, Patient Position: Sitting, Cuff Size: Normal)   Pulse 76   Temp 99.1 F (37.3 C) (Oral)  Wt 207 lb (93.9 kg)   SpO2 98%   BMI 34.45 kg/m       Assessment/Plan:   Hypothyroidism, post ablative.  Her dose has been stable at 200 mcg levothyroxine Also has consistent compliance with her levothyroxine now Is taking it daily on empty stomach in the mornings as directed Subjectively doing well  Since she missed her levothyroxine the last 3 mornings will not check her labs today but wait till the end of the month to check it again If thyroid levels are back to normal she can come back again in 6 months for follow-up   Elayne Snare 01/10/2020, 4:25 PM

## 2020-02-04 ENCOUNTER — Other Ambulatory Visit: Payer: PRIVATE HEALTH INSURANCE

## 2020-07-07 ENCOUNTER — Other Ambulatory Visit: Payer: PRIVATE HEALTH INSURANCE

## 2020-07-14 ENCOUNTER — Ambulatory Visit: Payer: PRIVATE HEALTH INSURANCE | Admitting: Endocrinology

## 2020-09-15 ENCOUNTER — Telehealth: Payer: Self-pay | Admitting: Endocrinology

## 2020-09-15 NOTE — Telephone Encounter (Signed)
REFILL REQUEST   Levothyroxine  [facility] ([address] [place]  [address] [place] ([place]) [place] [address]  Phone:  [phone] Fax:  [phone]

## 2020-09-18 ENCOUNTER — Other Ambulatory Visit: Payer: Self-pay | Admitting: *Deleted

## 2020-09-18 MED ORDER — LEVOTHYROXINE SODIUM 200 MCG PO TABS: 200.0000 ug | ORAL_TABLET | Freq: Every day | ORAL | 0 refills | Status: DC

## 2020-09-22 ENCOUNTER — Other Ambulatory Visit: Payer: Self-pay | Admitting: Endocrinology

## 2020-09-22 ENCOUNTER — Other Ambulatory Visit (INDEPENDENT_AMBULATORY_CARE_PROVIDER_SITE_OTHER): Payer: PRIVATE HEALTH INSURANCE

## 2020-09-22 ENCOUNTER — Other Ambulatory Visit: Payer: Self-pay

## 2020-09-22 DIAGNOSIS — E890 Postprocedural hypothyroidism: Secondary | ICD-10-CM

## 2020-09-22 LAB — TSH: TSH: 11.04 u[IU]/mL — ABNORMAL HIGH (ref 0.35–4.50)

## 2020-09-22 LAB — T4, FREE: Free T4: 0.72 ng/dL (ref 0.60–1.60)

## 2020-10-05 ENCOUNTER — Ambulatory Visit (INDEPENDENT_AMBULATORY_CARE_PROVIDER_SITE_OTHER): Payer: PRIVATE HEALTH INSURANCE | Admitting: Endocrinology

## 2020-10-05 ENCOUNTER — Other Ambulatory Visit: Payer: Self-pay

## 2020-10-05 ENCOUNTER — Encounter: Payer: Self-pay | Admitting: Endocrinology

## 2020-10-05 VITALS — BP 116/78 | HR 60 | Ht 65.0 in | Wt 206.0 lb

## 2020-10-05 DIAGNOSIS — E890 Postprocedural hypothyroidism: Secondary | ICD-10-CM | POA: Diagnosis not present

## 2020-10-05 MED ORDER — LEVOTHYROXINE SODIUM 200 MCG PO TABS: ORAL_TABLET | ORAL | 0 refills | Status: DC

## 2020-10-05 NOTE — Progress Notes (Signed)
Patient ID: [name], female   DOB: 1975/12/18, 45 y.o.   MRN: [id number]   Reason for Appointment:  Hypothyroidism, followup visit   History of Present Illness:   HYPOTHYROIDISM was first diagnosed  after her I-131 treatment for Graves' disease in 2003 She had been on relatively large doses of thyroxine supplement up to 225 mcg and has been irregular with her followup   She was last seen in 04/2019 Since 09/2018 she has been taking 200 g daily of generic levothyroxine from [facility]  She has been quite consistent with taking her levothyroxine daily before breakfast now  Over the last month or so she is starting to get exhausted especially the last few days when she has been moving to a new residence Her weight is about the same She has had some dry skin but no hair loss  Her TSH is unusually high at 11, previously normal  She has been taking her thyroid supplement an hour before breakfast without any food, milk or calcium/iron supplements   Wt Readings from Last 3 Encounters:  10/05/20 206 lb (93.4 kg)  01/10/20 207 lb (93.9 kg)  09/21/18 211 lb 3.2 oz (95.8 kg)     Lab Results  Component Value Date   TSH 11.04 (H) 09/22/2020   TSH 2.32 04/23/2019   TSH 16.39 (H) 09/16/2018   FREET4 0.72 09/22/2020   FREET4 1.00 04/23/2019   FREET4 0.90 09/16/2018       Allergies as of 10/05/2020      Reactions   Tape    Vicodin [hydrocodone-acetaminophen] Nausea And Vomiting      Medication List       Accurate as of October 05, 2020  4:29 PM. If you have any questions, ask your nurse or doctor.        Cholecalciferol 125 MCG (5000 UT) capsule Take 5,000 Units by mouth daily. TAKE 1 TABLET BY MOUTH ONCE DAILY.   Eliquis 5 MG Tabs tablet Generic drug: apixaban 5 mg. TAKE 1 TABLET BY MOUTH TWICE DAILY.   levothyroxine 200 MCG tablet Commonly known as: Synthroid Take 1 tablet (200 mcg total) by mouth daily before breakfast.       Allergies:  Allergies   Allergen Reactions  . Tape   . Vicodin [Hydrocodone-Acetaminophen] Nausea And Vomiting    Past Medical History:  Diagnosis Date  . Breast cancer ([place])   . H/O echocardiogram 02/20/2007, 06/03/2002   normal transthoracic echocardiogram  . Hypothyroid   . Varicose veins     Past Surgical History:  Procedure Laterality Date  . VEIN LIGATION AND STRIPPING      Family History  Problem Relation Age of Onset  . Diabetes type I Father   . Diabetes type II Sister   . Hypertension Sister   . Diabetes Sister   . Kidney failure Mother   . Kidney disease Maternal Grandmother   . Diabetes Paternal Grandmother     Social History:  reports that she has never smoked. She has never used smokeless tobacco. She reports that she does not drink alcohol and does not use drugs.  REVIEW Of SYSTEMS:  She is on long-term Eliquis for history of pulmonary embolism   Examination:   BP 116/78   Pulse 60   Ht 5\' 5"  (1.651 m)   Wt 206 lb (93.4 kg)   SpO2 99%   BMI 34.28 kg/m   No thyroid enlargement felt Biceps reflexes appear normal Skin appears normal  Assessment/Plan:   Hypothyroidism, post ablative.  Her dose has been stable at 200 mcg levothyroxine for some time Although she has taken her levothyroxine quite regularly including recently she appears to be hypothyroid now She has had increased symptoms of fatigue although this could be multifactorial  TSH is 11 which is unusual  Since she just filled a prescription for levothyroxine she can continue the 200 mcg prescription but take extra tablet once a week which will give her an equivalent of about 228 mcg daily  Follow-up in 2 months  Elayne Snare 10/05/2020, 4:29 PM

## 2020-10-24 ENCOUNTER — Telehealth: Payer: Self-pay | Admitting: Endocrinology

## 2020-10-24 NOTE — Telephone Encounter (Signed)
MEDICATION: levothyroxine  PHARMACY:   [facility] ([address] [place] Phone:  [phone]  Fax:  [phone]      HAS THE PATIENT CONTACTED THEIR PHARMACY?  yes  IS THIS A 90 DAY SUPPLY : yes  IS PATIENT OUT OF MEDICATION: not yet  IF NOT; HOW MUCH IS LEFT: 3  LAST APPOINTMENT DATE: @2 /10/2020  NEXT APPOINTMENT DATE:@Visit  date not found  DO WE HAVE YOUR PERMISSION TO LEAVE A DETAILED MESSAGE?:  OTHER COMMENTS:    **Let patient know to contact pharmacy at the end of the day to make sure medication is ready. **  ** Please notify patient to allow 48-72 hours to process**  **Encourage patient to contact the pharmacy for refills or they can request refills through [facility]**

## 2020-10-26 MED ORDER — LEVOTHYROXINE SODIUM 200 MCG PO TABS: ORAL_TABLET | ORAL | 0 refills | Status: DC

## 2020-10-28 NOTE — Telephone Encounter (Signed)
Called pharmacy ---verified already pick-up thyroid medication.

## 2020-12-08 ENCOUNTER — Other Ambulatory Visit (INDEPENDENT_AMBULATORY_CARE_PROVIDER_SITE_OTHER): Payer: PRIVATE HEALTH INSURANCE

## 2020-12-08 ENCOUNTER — Other Ambulatory Visit: Payer: Self-pay

## 2020-12-08 DIAGNOSIS — E890 Postprocedural hypothyroidism: Secondary | ICD-10-CM | POA: Diagnosis not present

## 2020-12-08 LAB — TSH: TSH: 0.02 u[IU]/mL — ABNORMAL LOW (ref 0.35–4.50)

## 2020-12-08 LAB — T4, FREE: Free T4: 1.38 ng/dL (ref 0.60–1.60)

## 2020-12-14 ENCOUNTER — Other Ambulatory Visit: Payer: PRIVATE HEALTH INSURANCE

## 2020-12-19 ENCOUNTER — Encounter: Payer: Self-pay | Admitting: Endocrinology

## 2020-12-19 ENCOUNTER — Ambulatory Visit: Payer: PRIVATE HEALTH INSURANCE | Admitting: Endocrinology

## 2020-12-19 ENCOUNTER — Other Ambulatory Visit: Payer: Self-pay

## 2020-12-19 VITALS — BP 114/74 | HR 67 | Ht 65.0 in | Wt 201.8 lb

## 2020-12-19 DIAGNOSIS — E890 Postprocedural hypothyroidism: Secondary | ICD-10-CM

## 2020-12-19 MED ORDER — EUTHYROX 200 MCG PO TABS: 200.0000 ug | ORAL_TABLET | Freq: Every day | ORAL | 2 refills | Status: DC

## 2020-12-19 NOTE — Progress Notes (Signed)
Patient ID: [name], female   DOB: 1976/05/04, 45 y.o.   MRN: [id number]   Reason for Appointment:  Hypothyroidism, followup visit   History of Present Illness:   HYPOTHYROIDISM was first diagnosed  after her I-131 treatment for Graves' disease in 2003 She had been on relatively large doses of thyroxine supplement up to 225 mcg and has been irregular with her followup   Since 09/2018 she has been prescribed 200 g of generic levothyroxine from [facility]  She has been quite consistent with taking her levothyroxine daily before breakfast and had not missed doses on her last visit  In 2/22 she had complained of significant fatigue for about a month or so Since her TSH was higher than usual at 11 she was told to take 8 tablets a week of her 200 mcg levothyroxine  With this she has had less fatigue although at times will get tired She is also recently trying to lose weight and is down 5 pounds No palpitations or shakiness  She has been taking her thyroid supplement an hour before breakfast daily, not on calcium/iron supplements  TSH is however suppressed at 0.02 with relatively higher free T4  Wt Readings from Last 3 Encounters:  12/19/20 201 lb 12.8 oz (91.5 kg)  10/05/20 206 lb (93.4 kg)  01/10/20 207 lb (93.9 kg)     Lab Results  Component Value Date   TSH 0.02 (L) 12/08/2020   TSH 11.04 (H) 09/22/2020   TSH 2.32 04/23/2019   FREET4 1.38 12/08/2020   FREET4 0.72 09/22/2020   FREET4 1.00 04/23/2019       Allergies as of 12/19/2020      Reactions   Tape    Vicodin [hydrocodone-acetaminophen] Nausea And Vomiting      Medication List       Accurate as of December 19, 2020  4:29 PM. If you have any questions, ask your nurse or doctor.        Cholecalciferol 125 MCG (5000 UT) capsule Take 5,000 Units by mouth daily. TAKE 1 TABLET BY MOUTH ONCE DAILY.   Eliquis 5 MG Tabs tablet Generic drug: apixaban 5 mg. TAKE 1 TABLET BY MOUTH TWICE DAILY.    levothyroxine 200 MCG tablet Commonly known as: Synthroid 1 tablet before breakfast daily except 2 tablets on Friday mornings       Allergies:  Allergies  Allergen Reactions  . Tape   . Vicodin [Hydrocodone-Acetaminophen] Nausea And Vomiting    Past Medical History:  Diagnosis Date  . Breast cancer ([place])   . H/O echocardiogram 02/20/2007, 06/03/2002   normal transthoracic echocardiogram  . Hypothyroid   . Varicose veins     Past Surgical History:  Procedure Laterality Date  . VEIN LIGATION AND STRIPPING      Family History  Problem Relation Age of Onset  . Diabetes type I Father   . Diabetes type II Sister   . Hypertension Sister   . Diabetes Sister   . Kidney failure Mother   . Kidney disease Maternal Grandmother   . Diabetes Paternal Grandmother     Social History:  reports that she has never smoked. She has never used smokeless tobacco. She reports that she does not drink alcohol and does not use drugs.  REVIEW Of SYSTEMS:  She is on long-term Eliquis for history of pulmonary embolism   Examination:   BP 114/74   Pulse 67   Ht 5\' 5"  (1.651 m)   Wt 201  lb 12.8 oz (91.5 kg)   SpO2 99%   BMI 33.58 kg/m      Assessment/Plan:   Hypothyroidism, post ablative.  Her levothyroxine was increased in February because of high TSH, now taking 8 tablets a week of the 200 mcg dose Although she was symptomatically better with increasing her dose her TSH is now suppressed Not clear if she is getting a different generic from [place] causing the change in her TSH now No symptoms suggestive of hyperthyroidism  She will now go back to 200 mcg dosage once a day but switched to Euthyrox brand and will continue this long-term if possible  Follow-up labs in 2 months  Elayne Snare 12/19/2020, 4:29 PM

## 2021-04-09 ENCOUNTER — Other Ambulatory Visit: Payer: PRIVATE HEALTH INSURANCE

## 2021-04-12 ENCOUNTER — Other Ambulatory Visit: Payer: PRIVATE HEALTH INSURANCE

## 2021-04-16 ENCOUNTER — Ambulatory Visit: Payer: Self-pay | Admitting: Endocrinology

## 2021-09-17 ENCOUNTER — Telehealth: Payer: Self-pay

## 2021-09-17 DIAGNOSIS — E890 Postprocedural hypothyroidism: Secondary | ICD-10-CM

## 2021-09-17 MED ORDER — LEVOTHYROXINE SODIUM 200 MCG PO TABS: 200.0000 ug | ORAL_TABLET | Freq: Every day | ORAL | 0 refills | Status: DC

## 2021-09-17 NOTE — Telephone Encounter (Signed)
Rx sent for 30 days and patient scheduled for labs and follow up

## 2021-09-17 NOTE — Telephone Encounter (Signed)
Per Pharmacy Euthyrox medication is no longer available requesting Rx to be sent for levothyroxine. Please advise.

## 2021-10-01 ENCOUNTER — Other Ambulatory Visit: Payer: Self-pay

## 2021-10-01 ENCOUNTER — Other Ambulatory Visit (INDEPENDENT_AMBULATORY_CARE_PROVIDER_SITE_OTHER): Payer: PRIVATE HEALTH INSURANCE

## 2021-10-01 DIAGNOSIS — E890 Postprocedural hypothyroidism: Secondary | ICD-10-CM | POA: Diagnosis not present

## 2021-10-02 LAB — T4, FREE: Free T4: 0.4 ng/dL — ABNORMAL LOW (ref 0.60–1.60)

## 2021-10-02 LAB — TSH: TSH: 31.29 u[IU]/mL — ABNORMAL HIGH (ref 0.35–5.50)

## 2021-10-08 ENCOUNTER — Ambulatory Visit: Payer: PRIVATE HEALTH INSURANCE | Admitting: Endocrinology

## 2021-10-16 ENCOUNTER — Ambulatory Visit: Payer: PRIVATE HEALTH INSURANCE | Admitting: Endocrinology

## 2021-10-16 NOTE — Progress Notes (Unsigned)
Patient ID: [name], female   DOB: [birth year], 46 y.o.   MRN: [id number]   Reason for Appointment:  Hypothyroidism, followup visit   History of Present Illness:   HYPOTHYROIDISM was first diagnosed  after her I-131 treatment for Graves' disease in 2003 She had been on relatively large doses of thyroxine supplement up to 225 mcg and has been irregular with her followup   Since 09/2018 she has been prescribed 200 g of generic levothyroxine from [facility]  She has been quite consistent with taking her levothyroxine daily before breakfast and had not missed doses on her last visit  In 2/22 she had complained of significant fatigue for about a month or so Since her TSH was higher than usual at 11 she was told to take 8 tablets a week of her 200 mcg levothyroxine  With this she has had less fatigue although at times will get tired She is also recently trying to lose weight and is down 5 pounds No palpitations or shakiness  She has been taking her thyroid supplement an hour before breakfast daily, not on calcium/iron supplements  TSH is however suppressed at 0.02 with relatively higher free T4  Wt Readings from Last 3 Encounters:  12/19/20 201 lb 12.8 oz (91.5 kg)  10/05/20 206 lb (93.4 kg)  01/10/20 207 lb (93.9 kg)     Lab Results  Component Value Date   TSH 31.29 (H) 10/01/2021   TSH 0.02 (L) 12/08/2020   TSH 11.04 (H) 09/22/2020   FREET4 0.40 (L) 10/01/2021   FREET4 1.38 12/08/2020   FREET4 0.72 09/22/2020       Allergies as of 10/16/2021       Reactions   Tape    Vicodin [hydrocodone-acetaminophen] Nausea And Vomiting        Medication List        Accurate as of October 16, 2021  8:05 AM. If you have any questions, ask your nurse or doctor.          Cholecalciferol 125 MCG (5000 UT) capsule Take 5,000 Units by mouth daily. TAKE 1 TABLET BY MOUTH ONCE DAILY.   Eliquis 5 MG Tabs tablet Generic drug: apixaban 5 mg. TAKE 1 TABLET BY  MOUTH TWICE DAILY.   levothyroxine 200 MCG tablet Commonly known as: SYNTHROID Take 1 tablet (200 mcg total) by mouth daily.        Allergies:  Allergies  Allergen Reactions   Tape    Vicodin [Hydrocodone-Acetaminophen] Nausea And Vomiting    Past Medical History:  Diagnosis Date   Breast cancer ([place])    H/O echocardiogram 02/20/2007, 06/03/2002   normal transthoracic echocardiogram   Hypothyroid    Varicose veins     Past Surgical History:  Procedure Laterality Date   VEIN LIGATION AND STRIPPING      Family History  Problem Relation Age of Onset   Diabetes type I Father    Diabetes type II Sister    Hypertension Sister    Diabetes Sister    Kidney failure Mother    Kidney disease Maternal Grandmother    Diabetes Paternal Grandmother     Social History:  reports that she has never smoked. She has never used smokeless tobacco. She reports that she does not drink alcohol and does not use drugs.  REVIEW Of SYSTEMS:  She is on long-term Eliquis for history of pulmonary embolism   Examination:   There were no vitals taken for this visit.  Assessment/Plan:   Hypothyroidism, post ablative.  Her levothyroxine was increased in February because of high TSH, now taking 8 tablets a week of the 200 mcg dose Although she was symptomatically better with increasing her dose her TSH is now suppressed Not clear if she is getting a different generic from [place] causing the change in her TSH now No symptoms suggestive of hyperthyroidism  She will now go back to 200 mcg dosage once a day but switched to Euthyrox brand and will continue this long-term if possible  Follow-up labs in 2 months  Garek Schuneman 10/16/2021, 8:05 AM

## 2022-01-24 ENCOUNTER — Encounter: Payer: Self-pay | Admitting: Neurology

## 2022-01-24 ENCOUNTER — Telehealth: Payer: Self-pay | Admitting: *Deleted

## 2022-01-24 ENCOUNTER — Ambulatory Visit (INDEPENDENT_AMBULATORY_CARE_PROVIDER_SITE_OTHER): Payer: PRIVATE HEALTH INSURANCE | Admitting: Neurology

## 2022-01-24 VITALS — BP 115/59 | HR 79 | Ht 64.5 in | Wt 174.0 lb

## 2022-01-24 DIAGNOSIS — Z79899 Other long term (current) drug therapy: Secondary | ICD-10-CM | POA: Diagnosis not present

## 2022-01-24 DIAGNOSIS — C50412 Malignant neoplasm of upper-outer quadrant of left female breast: Secondary | ICD-10-CM | POA: Diagnosis not present

## 2022-01-24 DIAGNOSIS — R260 Ataxic gait: Secondary | ICD-10-CM | POA: Insufficient documentation

## 2022-01-24 DIAGNOSIS — R3915 Urgency of urination: Secondary | ICD-10-CM | POA: Insufficient documentation

## 2022-01-24 DIAGNOSIS — G35 Multiple sclerosis: Secondary | ICD-10-CM | POA: Diagnosis not present

## 2022-01-24 NOTE — Telephone Encounter (Signed)
Placed [facility] lab in [facility] lock box for routine lab pick up. Results pending. 

## 2022-01-24 NOTE — Progress Notes (Signed)
[facility]  PATIENT: KAMERYN DAVERN DOB: Apr 20, 1976  REFERRING DOCTOR OR PCP:  Ward Givens SOURCE: patient, notes from PCP/endocrinology/hemonc, labs and imaging reports  _________________________________   HISTORICAL  CHIEF COMPLAINT:  Chief Complaint  Patient presents with   New Patient (Initial Visit)    Pt alone, rm 2 diagnosed with MS in 2014. Was temporarily on Gilenya but then became preganant and stopped. Has not been on a DMT. Here to re establish care.     HISTORY OF PRESENT ILLNESS:  I had the pleasure seeing you patient, Rahma Meller, at the [place] center at [facility] for neurologic consultation regarding her multiple sclerosis. I have previously seen her at [facility] neurology, time in late 2014 and [facility] in 2018.     She is a 46 year old woman who was diagnosed with MS in 2014 after presenting with vertigo and gait ataxia and being found to have an enhancing lesion in the spinal cord adjacent to C4 and other changes consistent with MS in the brain.  Details of history below.     She has not had any exacerbations since her presenting severe symptoms in 2014. She feels she has made a near complete recovery.    Currently, her gait and balance are doing well.   If tired she will use the bannister going downstairs but not most of the time.  She could easily walk 3 miles at a good clip.   She still has a poorr tandem gait.   She denies weakness, spasticity or numbness in the limbs.   She occasionally has some discomfort in the hip/lower flank region.  She has urinary urgency/frequency though this seems to fluctuate.     She has never had optic neuritis or diplopia.  Near vision is more blurry than distant vision  She notes mild fatigue but is able to do any activity that she wants to. She has insomnia, sleep maintenance worse than onset. . She denies any difficulties with depression or anxiety. Cognition is fine.  We discussed different  options for a DMT.   Due to the breast cancer, I would be reluctant to use one of the anti-CD20 agents, cladribine or any of the S1P receptor modulators.   Due to GI issues the fumarates might not be tolerated.    Aubagio might be a good choice balancing safety and efficacy, or Tysabri if the MS appears more active.      She had a TB test (negative) at work in March 2023.     CBC/D 10/29/2021 showed lymphocyte cout low at 0.6.    MS History She was diagnosed with MS in August 2014 after presenting with vertigo followed a few days later by gait ataxia and right sided numbness.  MRI of the cervical spine 04/26/2013 showed an enhancing lesion to the right at C4. MRI of the brain 05/06/2013 showed lesions consistent with MS including a  right frontal lobe lesion that enhanced. Foci were also noted in   middle cerebellar peduncles. Showed multiple    MRI 05/14/2013 at [facility] showed more changes c/w MS.   No LP was needed.   She did Rehab and I started to see her in October 2014.     She started Gilenya 06/2013 and stopped 12/2013 for pregnancy.   She delivered a premature baby (28 weeks).    Her baby has done well.         She was diagnosed with breast cancer 06/2015 and she had  chemotherapy (Hceptin, Taxotere, Carboplatin, Perjeta), mastectomy and reconstructive surgery (left).   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   Since her initial exacerbation in 2014, she has had no other exacerbation.    She is being maintained on Nerlyn.   Her Oncologist is Dr. Kasandra Knudsen ([facility] [place]  [phone]).   She had a cancer relapse 2022 and was placed on chemotherpy.  As a compication, she had colitis.  She will be getting axillary node surgery June 2023.   She was on Keytruda end of 2022 and Jan 2023 but due to GI issues it was stopped.         REVIEW OF SYSTEMS: Constitutional: No fevers, chills, sweats, or change in appetite Eyes: No  visual changes, double vision, eye pain Ear, nose and throat: No hearing loss, ear pain, nasal congestion, sore throat Cardiovascular: No chest pain, palpitations Respiratory:  No shortness of breath at rest or with exertion.   No wheezes GastrointestinaI: No nausea, vomiting, diarrhea, abdominal pain, fecal incontinence Genitourinary:  No dysuria, urinary retention or frequency.  No nocturia. Musculoskeletal:  No neck pain, back pain Integumentary: No rash, pruritus, skin lesions Neurological: as above Psychiatric: No depression at this time.  No anxiety Endocrine:  she had iodine ablation of her thyroid due to thyroiditis and hypothyroidism. Hematologic/Lymphatic:  No anemia, purpura, petechiae.  She had breast cancer  Allergic/Immunologic: No itchy/runny eyes, nasal congestion, recent allergic reactions, rashes  ALLERGIES: Allergies  Allergen Reactions   Tape    Vicodin [Hydrocodone-Acetaminophen] Nausea And Vomiting    HOME MEDICATIONS:  Current Outpatient Medications:    Cholecalciferol 25 MCG (1000 UT) tablet, Take by mouth., Disp: , Rfl:    ELIQUIS 5 MG TABS tablet, 5 mg. TAKE 1 TABLET BY MOUTH TWICE DAILY., Disp: , Rfl:    levothyroxine (SYNTHROID) 175 MCG tablet, Take 175 mcg by mouth daily before breakfast., Disp: , Rfl:    ondansetron (ZOFRAN) 4 MG tablet, Take 4 mg by mouth daily as needed., Disp: , Rfl:    prochlorperazine (COMPAZINE) 10 MG tablet, Take by mouth., Disp: , Rfl:    dicyclomine (BENTYL) 20 MG tablet, Take 20 mg by mouth 3 (three) times daily. (Patient not taking: Reported on 01/24/2022), Disp: , Rfl:   PAST MEDICAL HISTORY: Past Medical History:  Diagnosis Date   Breast cancer ([place])    H/O echocardiogram 02/20/2007, 06/03/2002   normal transthoracic echocardiogram   Hypothyroid    Varicose veins     PAST SURGICAL HISTORY: Past Surgical History:  Procedure Laterality Date   mastectomy Left    2018   VEIN LIGATION AND STRIPPING      FAMILY  HISTORY: Family History  Problem Relation Age of Onset   Diabetes type I Father    Diabetes type II Sister    Hypertension Sister    Diabetes Sister    Kidney failure Mother    Kidney disease Maternal Grandmother    Diabetes Paternal Grandmother     SOCIAL HISTORY:  Social History   Socioeconomic History   Marital status: Married    Spouse name: Not on file   Number of children: 2   Years of education: Not on file   Highest education level: Not on file  Occupational History   Occupation: cna  Tobacco Use   Smoking status: Never   Smokeless tobacco: Never  Substance and Sexual Activity  Alcohol use: No   Drug use: No   Sexual activity: Not on file  Other Topics Concern   Not on file  Social History Narrative   Not on file   Social Determinants of Health   Financial Resource Strain: Not on file  Food Insecurity: Not on file  Transportation Needs: Not on file  Physical Activity: Not on file  Stress: Not on file  Social Connections: Not on file  Intimate Partner Violence: Not on file     PHYSICAL EXAM  Vitals:   01/24/22 0852  BP: (!) 115/59  Pulse: 79  Weight: 174 lb (78.9 kg)  Height: 5' 4.5" (1.638 m)    Body mass index is 29.41 kg/m.   General: The patient is well-developed and well-nourished and in no acute distress  Eyes:  Funduscopic exam shows normal optic discs and retinal vessels.  Neck: The neck is supple, no carotid bruits are noted.  The neck is nontender.  Cardiovascular: The heart has a regular rate and rhythm with a normal S1 and S2. There were no murmurs, gallops or rubs. Lungs are clear to auscultation.  Skin: Extremities are without significant edema.  Musculoskeletal:  Back is nontender  Neurologic Exam  Mental status: The patient is alert and oriented x 3 at the time of the examination. The patient has apparent normal recent and remote memory, with an apparently normal attention span and concentration ability.   Speech is  normal.  Cranial nerves: Extraocular movements are full. Pupils are equal, round, and reactive to light and accomodation.  Visual fields are full.  Facial symmetry is present. There is good facial sensation to soft touch bilaterally.Facial strength is normal.  Trapezius and sternocleidomastoid strength is normal. No dysarthria is noted.  The tongue is midline, and the patient has symmetric elevation of the soft palate. No obvious hearing deficits are noted but Weber lateralized right.  Motor:  Muscle bulk is normal.   Tone is normal. Strength is  5 / 5 in all 4 extremities.   Sensory: Sensory testing is intact to pinprick, soft touch and vibration sensation in all 4 extremities.  Coordination: Cerebellar testing reveals good finger-nose-finger and heel-to-shin bilaterally.  Gait and station: Station is normal.   Gait is normal. Tandem gait is wide. Romberg is negative.   Reflexes: Deep tendon reflexes are symmetric and normal in arms but spread at knees, right > left and 2 beats clonus at ankles.   Plantar responses are flexor.    DIAGNOSTIC DATA (LABS, IMAGING, TESTING) - I reviewed patient records, labs, notes, testing and imaging myself where available.  Lab Results  Component Value Date   WBC 5.3 05/12/2017   HGB 11.8 05/12/2017   HCT 34.6 05/12/2017   MCV 89 05/12/2017   PLT 259 05/12/2017      Component Value Date/Time   NA 142 05/12/2017 1445   K 3.7 05/12/2017 1445   CL 103 05/12/2017 1445   CO2 23 05/12/2017 1445   GLUCOSE 96 05/12/2017 1445   GLUCOSE 94 01/26/2016 1830   BUN 9 05/12/2017 1445   CREATININE 1.22 (H) 05/12/2017 1445   CALCIUM 10.2 05/12/2017 1445   PROT 7.8 05/12/2017 1445   ALBUMIN 4.6 05/12/2017 1445   AST 22 05/12/2017 1445   ALT 9 05/12/2017 1445   ALKPHOS 54 05/12/2017 1445   BILITOT 0.3 05/12/2017 1445   GFRNONAA 55 (L) 05/12/2017 1445   GFRAA 63 05/12/2017 1445   No results found for: CHOL, HDL, LDLCALC, LDLDIRECT,  TRIG, CHOLHDL No  results found for: HGBA1C No results found for: VITAMINB12 Lab Results  Component Value Date   TSH 31.29 (H) 10/01/2021       ASSESSMENT AND PLAN  Multiple sclerosis ([place]) - Plan: MR BRAIN W WO CONTRAST, MR CERVICAL SPINE W WO CONTRAST, CBC with Differential/Platelet, Hepatic function panel, Stratify JCV Antibody Test ([facility])  Malignant neoplasm of upper-outer quadrant of left female breast, unspecified estrogen receptor status ([place]) - Plan: MR BRAIN W WO CONTRAST, MR CERVICAL SPINE W WO CONTRAST  High risk medication use - Plan: CBC with Differential/Platelet, Hepatic function panel, Stratify JCV Antibody Test ([facility])  Urinary urgency  Ataxic gait    In summary, Asyia Hornung is a 46 year old woman diagnosed with MS in 2014 and also has a history of breast cancer with recent treatment.  She has been the majority of the time since the diagnosis in 2014 off of any DMT and was on Gilenya for 5-6 months earlier.   She is now ready to get back on a therapy. We need to check an MRI of the brain and cervical spine with and without contrast to better determine the aggressiveness of her MS. If we can get the old images I will compare them side by side.   We discussed DMT options.  She would prefer not to do a self injectable medication due to needle phobia.  I would avoid the anti-CD20 agents as that they might be associated with a higher risk of breast cancer.  Likewise, cladribine, Gilenya, Zeposia or Mayzent would be poor options due to there having been associated with a higher risk of certain cancers.  Aubagio (teriflunomide) would be a reasonable option if her MS does not appear to be too aggressive on the MRIs.  Due to GI tolerability issues, Tecfidera or Vumerity are not good options.  If the MS appears more aggressive Tysabri would be a good choice.  We will check some lab work to help [place] make the decision as well.  Based on the MRI we will get her started on 1 of these agents.  She will  return to see me in 4 months or sooner if there are new or worsening neurologic symptoms.    Exilda Wilhite A. Felecia Shelling, MD, [facility] 3/53/6144, 31:54 AM Certified in Neurology, Clinical Neurophysiology, Sleep Medicine, Pain Medicine and Neuroimaging  [facility] [address], [place], [place] [address] [phone]

## 2022-01-25 ENCOUNTER — Telehealth: Payer: Self-pay | Admitting: Neurology

## 2022-01-25 LAB — HEPATIC FUNCTION PANEL
ALT: 61 IU/L — ABNORMAL HIGH (ref 0–32)
AST: 67 IU/L — ABNORMAL HIGH (ref 0–40)
Albumin: 4.2 g/dL (ref 3.8–4.8)
Alkaline Phosphatase: 68 IU/L (ref 44–121)
Bilirubin Total: 0.3 mg/dL (ref 0.0–1.2)
Bilirubin, Direct: <0.1 mg/dL (ref 0.00–0.40)
Total Protein: 7 g/dL (ref 6.0–8.5)

## 2022-01-25 LAB — CBC WITH DIFFERENTIAL/PLATELET
Basophils Absolute: 0 10*3/uL (ref 0.0–0.2)
Basos: 1 %
EOS (ABSOLUTE): 0 10*3/uL (ref 0.0–0.4)
Eos: 0 %
Hematocrit: 37.1 % (ref 34.0–46.6)
Hemoglobin: 12.7 g/dL (ref 11.1–15.9)
Immature Grans (Abs): 0 10*3/uL (ref 0.0–0.1)
Immature Granulocytes: 0 %
Lymphocytes Absolute: 1.4 10*3/uL (ref 0.7–3.1)
Lymphs: 44 %
MCH: 30.1 pg (ref 26.6–33.0)
MCHC: 34.2 g/dL (ref 31.5–35.7)
MCV: 88 fL (ref 79–97)
Monocytes Absolute: 0.3 10*3/uL (ref 0.1–0.9)
Monocytes: 11 %
Neutrophils Absolute: 1.4 10*3/uL (ref 1.4–7.0)
Neutrophils: 44 %
Platelets: 270 10*3/uL (ref 150–450)
RBC: 4.22 x10E6/uL (ref 3.77–5.28)
RDW: 14.2 % (ref 11.7–15.4)
WBC: 3.1 10*3/uL — ABNORMAL LOW (ref 3.4–10.8)

## 2022-01-25 NOTE — Telephone Encounter (Signed)
I spoke to the patient and let her know that our mobile unit does not take her current insurance, as she was looking to be scheduled before June because she has met her deductible. She understood and will call me June 1 with new insurance info for [employer].  

## 2022-01-31 NOTE — Telephone Encounter (Signed)
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 I will forward this information to Dr Felecia Shelling for review.

## 2022-02-06 ENCOUNTER — Telehealth: Payer: Self-pay | Admitting: *Deleted

## 2022-02-06 NOTE — Telephone Encounter (Signed)
-----   Message from Britt Bottom, MD sent at 02/05/2022  6:24 PM EDT ----- At the last visit, we had discussed Tysabri and Aubagio as to possible DMT's given her history of breast cancer and GI issues.  She is JCV antibody positive so I would recommend that we start Aubagio.  Liver function tests were mildly elevated.  We can start Aubagio 14 mg.  It is necessary that she get liver function tests every month for the first 6 months.  (As the liver function tests are elevated I would prefer to do this out of our office or through [facility] or with [facility] so that results populate into epic)

## 2022-02-06 NOTE — Telephone Encounter (Signed)
Called pt and relayed Dr. Garth Bigness message. She verbalized understanding and ok with proceeding with Aubagio. She is currently in [place] pending surgery for Axillary node surgery for this Wed. She is supposed to be getting specialty med to take prior via mail. If she does not receive in time, she will have to come home and r/s. She will know more today.   I e-mailed her Aubagio start form at [email or web address]. She will sign and email back.

## 2022-02-12 NOTE — Telephone Encounter (Signed)
I spoke with Dr. Felecia Shelling. He is agreeable to patient waiting to start a DMT until after the MRIs are resulted.

## 2022-02-12 NOTE — Telephone Encounter (Signed)
[employer] [id number] LE  [phone] New insurance

## 2022-02-12 NOTE — Telephone Encounter (Signed)
I called patient. She received the aubagio start form but she would like to hold off on starting it until her MRIs are complete. I will let Dr. Felecia Shelling know this and if he has concerns then I will call her back. She asked to be transferred to [place] to give her insurance information and this call was sent.

## 2022-03-09 ENCOUNTER — Ambulatory Visit
Admission: RE | Admit: 2022-03-09 | Discharge: 2022-03-09 | Disposition: A | Discharge status: Home / Self Care | Payer: Self-pay | Source: Ambulatory Visit | Attending: Neurology | Admitting: Neurology

## 2022-03-09 DIAGNOSIS — C50412 Malignant neoplasm of upper-outer quadrant of left female breast: Secondary | ICD-10-CM

## 2022-03-09 DIAGNOSIS — G35 Multiple sclerosis: Secondary | ICD-10-CM | POA: Diagnosis not present

## 2022-03-09 MED ORDER — GADOBENATE DIMEGLUMINE 529 MG/ML IV SOLN
15.0000 mL | Freq: Once | INTRAVENOUS | Status: AC | PRN
  Administered 2022-03-09: 15 mL via INTRAVENOUS

## 2022-04-11 ENCOUNTER — Telehealth: Payer: Self-pay | Admitting: Endocrinology

## 2022-04-11 MED ORDER — LEVOTHYROXINE SODIUM 200 MCG PO TABS: 200.0000 ug | ORAL_TABLET | Freq: Every day | ORAL | 0 refills | Status: DC

## 2022-04-11 NOTE — Telephone Encounter (Signed)
MEDICATION: Levothorxyn  PHARMACY:  [facility], [address]  [place]  HAS THE PATIENT CONTACTED THEIR PHARMACY  No    IS THIS A 90 DAY SUPPLY : unknown  IS PATIENT OUT OF MEDICATION: yes  IF NOT; HOW MUCH IS LEFT:   LAST APPOINTMENT DATE: '@Visit'$  date not found  NEXT APPOINTMENT DATE:'@11'$ /09/2021  DO WE HAVE YOUR PERMISSION TO LEAVE A DETAILED MESSAGE?:Yes  OTHER COMMENTS:    **Let patient know to contact pharmacy at the end of the day to make sure medication is ready. **  ** Please notify patient to allow 48-72 hours to process**  **Encourage patient to contact the pharmacy for refills or they can request refills through [facility]**

## 2022-04-11 NOTE — Telephone Encounter (Signed)
266mg dose sent per Dr. KDwyane Dee

## 2022-05-29 ENCOUNTER — Ambulatory Visit: Payer: BC Managed Care – PPO | Admitting: Neurology

## 2022-05-29 ENCOUNTER — Encounter: Payer: Self-pay | Admitting: Neurology

## 2022-05-29 VITALS — BP 121/56 | HR 70 | Ht 64.5 in | Wt 188.8 lb

## 2022-05-29 DIAGNOSIS — R292 Abnormal reflex: Secondary | ICD-10-CM

## 2022-05-29 DIAGNOSIS — G35 Multiple sclerosis: Secondary | ICD-10-CM | POA: Diagnosis not present

## 2022-05-29 DIAGNOSIS — C50412 Malignant neoplasm of upper-outer quadrant of left female breast: Secondary | ICD-10-CM | POA: Diagnosis not present

## 2022-05-29 DIAGNOSIS — Z79899 Other long term (current) drug therapy: Secondary | ICD-10-CM | POA: Diagnosis not present

## 2022-05-29 DIAGNOSIS — R3915 Urgency of urination: Secondary | ICD-10-CM | POA: Diagnosis not present

## 2022-05-29 NOTE — Progress Notes (Signed)
[facility]  PATIENT: [name] DOB: [birth year]  REFERRING DOCTOR OR PCP:  Ward Givens SOURCE: patient, notes from PCP/endocrinology/hemonc, labs and imaging reports  _________________________________   HISTORICAL  CHIEF COMPLAINT:  Chief Complaint  Patient presents with   Follow-up    RM 1, alone. Last seen 01/24/22.  Not on MS DMT. Doing well, no new sx. Relayed MRI results from July 2023, says she never got via Pharmacist, community. I explained how she can get into mychart for future reference.     HISTORY OF PRESENT ILLNESS:  [name] is a 46 y.o.o wman with multiple sclerosis. She also has breast cancer.   She had surgery recently and will be doing radiation.    Update 05/29/2022  Currently, her gait and balance are doing well.   If tired she will use the bannister going downstairs but not most of the time.  She could easily walk 3 miles at a good clip.   She still has a poorr tandem gait.   She denies weakness, spasticity or numbness in the limbs.   She occasionally has some discomfort in the hip/lower flank region.  She has urinary urgency/frequency though this seems to fluctuate.     She has never had optic neuritis or diplopia.  Near vision is more blurry than distant vision  She notes mild fatigue but is able to do any activity that she wants to. She has insomnia, sleep maintenance worse than onset. . She denies any difficulties with depression or anxiety. Cognition is fine.  After her radiation, she will be doing chemotherapy (in [place]).       MS History: She was diagnosed with MS in 2014 after presenting with vertigo and gait ataxia and being found to have an enhancing lesion in the spinal cord adjacent to C4 and other changes consistent with MS in the brain.  Details of history below.     She has not had any exacerbations since her presenting severe symptoms in 2014. She feels she has made a near complete recovery.  She was briefly on a pill in 2014  (Tecfidera) x 3-4 months but stopped with pregnancy.  She never went back on a DMT.     We discussed different options for a DMT.   Due to the breast cancer, I would be reluctant to use one of the anti-CD20 agents, cladribine or any of the S1P receptor modulators.   Due to GI issues the fumarates might not be tolerated.    Aubagio might be a good choice balancing safety and efficacy, or Tysabri if the MS appears more active.      She had a TB test (negative) at work in March 2023.     CBC/D 10/29/2021 showed lymphocyte cout low at 0.6.    MS History She was diagnosed with MS in August 2014 after presenting with vertigo followed a few days later by gait ataxia and right sided numbness.  MRI of the cervical spine 04/26/2013 showed an enhancing lesion to the right at C4. MRI of the brain 05/06/2013 showed lesions consistent with MS including a  right frontal lobe lesion that enhanced. Foci were also noted in   middle cerebellar peduncles. Showed multiple    MRI 05/14/2013 at [facility] showed more changes c/w MS.   No LP was needed.   She did Rehab and I started to see her in October 2014.     She started Gilenya 06/2013 and stopped 12/2013 for pregnancy.  She delivered a premature baby (28 weeks).    Her baby has done well.         She was diagnosed with breast cancer 06/2015 and she had chemotherapy (Hceptin, Taxotere, Carboplatin, Perjeta), mastectomy and reconstructive surgery (left).   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   Since her initial exacerbation in 2014, she has had no other exacerbation.    She is being maintained on Nerlyn.   Her Oncologist is Dr. Kasandra Ayala ([facility] [place]  [phone]).   She had a cancer relapse 2022 and was placed on chemotherpy.  As a compication, she had colitis.  She will be getting axillary node surgery June 2023.   She was on Keytruda end of 2022 and Jan 2023 but due to GI issues it was  stopped.         REVIEW OF SYSTEMS: Constitutional: No fevers, chills, sweats, or change in appetite Eyes: No visual changes, double vision, eye pain Ear, nose and throat: No hearing loss, ear pain, nasal congestion, sore throat Cardiovascular: No chest pain, palpitations Respiratory:  No shortness of breath at rest or with exertion.   No wheezes GastrointestinaI: No nausea, vomiting, diarrhea, abdominal pain, fecal incontinence Genitourinary:  No dysuria, urinary retention or frequency.  No nocturia. Musculoskeletal:  No neck pain, back pain Integumentary: No rash, pruritus, skin lesions Neurological: as above Psychiatric: No depression at this time.  No anxiety Endocrine:  she had iodine ablation of her thyroid due to thyroiditis and hypothyroidism. Hematologic/Lymphatic:  No anemia, purpura, petechiae.  She had breast cancer  Allergic/Immunologic: No itchy/runny eyes, nasal congestion, recent allergic reactions, rashes  ALLERGIES: Allergies  Allergen Reactions   Tape    Vicodin [Hydrocodone-Acetaminophen] Nausea And Vomiting    HOME MEDICATIONS:  Current Outpatient Medications:    ELIQUIS 5 MG TABS tablet, 5 mg. TAKE 1 TABLET BY MOUTH TWICE DAILY., Disp: , Rfl:    levothyroxine (SYNTHROID) 200 MCG tablet, Take 1 tablet (200 mcg total) by mouth daily., Disp: 90 tablet, Rfl: 0  PAST MEDICAL HISTORY: Past Medical History:  Diagnosis Date   Breast cancer ([place])    H/O echocardiogram 02/20/2007, 06/03/2002   normal transthoracic echocardiogram   Hypothyroid    Varicose veins     PAST SURGICAL HISTORY: Past Surgical History:  Procedure Laterality Date   mastectomy Left    2018   VEIN LIGATION AND STRIPPING      FAMILY HISTORY: Family History  Problem Relation Age of Onset   Diabetes type I Father    Diabetes type II Sister    Hypertension Sister    Diabetes Sister    Kidney failure Mother    Kidney disease Maternal Grandmother    Diabetes Paternal Grandmother      SOCIAL HISTORY:  Social History   Socioeconomic History   Marital status: Married    Spouse name: Not on file   Number of children: 2   Years of education: Not on file   Highest education level: Not on file  Occupational History   Occupation: cna  Tobacco Use   Smoking status: Never   Smokeless tobacco: Never  Substance and Sexual Activity   Alcohol use: No   Drug use: No   Sexual activity: Not on file  Other Topics Concern   Not on file  Social History Narrative   Not on file   Social Determinants of Health  Financial Resource Strain: Not on file  Food Insecurity: Not on file  Transportation Needs: Not on file  Physical Activity: Not on file  Stress: Not on file  Social Connections: Not on file  Intimate Partner Violence: Not on file     PHYSICAL EXAM  Vitals:   05/29/22 1444  BP: (!) 121/56  Pulse: 70  Weight: 188 lb 12.8 oz (85.6 kg)  Height: 5' 4.5" (1.638 m)    Body mass index is 31.91 kg/m.   General: The patient is well-developed and well-nourished and in no acute distress  Skin/ext: She has mild left arm lymphedema  Musculoskeletal:  Back is nontender  Neurologic Exam  Mental status: The patient is alert and oriented x 3 at the time of the examination. The patient has apparent normal recent and remote memory, with an apparently normal attention span and concentration ability.   Speech is normal.  Cranial nerves: Extraocular movements are full. Pupils are equal, round, and reactive to light and accomodation.  Color vision is symmetric.  Facial strength and sensation are normal   No obvious hearing deficits are noted but Weber lateralized right.  Motor:  Muscle bulk is normal.   Tone is normal. Strength is  5 / 5 in all 4 extremities.   Sensory: Sensory testing is intact to pinprick, soft touch and vibration sensation in all 4 extremities.  Coordination: Cerebellar testing reveals good finger-nose-finger and heel-to-shin  bilaterally.  Gait and station: Station is normal.   Gait is normal. Tandem gait is mildly wide. Romberg is negative.   Reflexes: Deep tendon reflexes are symmetric and normal in arms but spread at knees, and 2 beats clonus at ankles.       DIAGNOSTIC DATA (LABS, IMAGING, TESTING) - I reviewed patient records, labs, notes, testing and imaging myself where available.  Lab Results  Component Value Date   WBC 3.1 (L) 01/24/2022   HGB 12.7 01/24/2022   HCT 37.1 01/24/2022   MCV 88 01/24/2022   PLT 270 01/24/2022      Component Value Date/Time   NA 142 05/12/2017 1445   K 3.7 05/12/2017 1445   CL 103 05/12/2017 1445   CO2 23 05/12/2017 1445   GLUCOSE 96 05/12/2017 1445   GLUCOSE 94 01/26/2016 1830   BUN 9 05/12/2017 1445   CREATININE 1.22 (H) 05/12/2017 1445   CALCIUM 10.2 05/12/2017 1445   PROT 7.0 01/24/2022 1005   ALBUMIN 4.2 01/24/2022 1005   AST 67 (H) 01/24/2022 1005   ALT 61 (H) 01/24/2022 1005   ALKPHOS 68 01/24/2022 1005   BILITOT 0.3 01/24/2022 1005   GFRNONAA 55 (L) 05/12/2017 1445   GFRAA 63 05/12/2017 1445   No results found for: "CHOL", "HDL", "LDLCALC", "LDLDIRECT", "TRIG", "CHOLHDL" No results found for: "HGBA1C" No results found for: "VITAMINB12" Lab Results  Component Value Date   TSH 31.29 (H) 10/01/2021       ASSESSMENT AND PLAN  Multiple sclerosis ([place])  Malignant neoplasm of upper-outer quadrant of left female breast, unspecified estrogen receptor status ([place])  High risk medication use  Urinary urgency  Hyperreflexia    We discussed that the MRI of the brain and cervical spine she had earlier this year did not show any active MS lesions.  By report, it appears as though her MS is rather stable (she has 9 or 10 plaques on the current MRI and 102 in the spinal cord.)  Clinically she has been very stable with no exacerbations since 2014, despite not  being on any disease modifying therapy. She will be doing radiation and chemotherapy for  breast cancer.  As her MS appears to be nonaggressive, I am going to hold off on restarting a disease modifying therapy at this time.  When she comes back May 2024, we will repeat the imaging studies.  If there is no progression we might consider continuing to stay off of a disease modifying therapy.  However, there is any progression I would want her to initiate a therapy, probably Aubagio unless the level progressiveness seems higher in which case we would consider Tysabri. Stay active and exercise as tolerated.  Return in 8 months or sooner if there are new or worsening neurologic symptoms.    Gelila Well A. Felecia Shelling, MD, [facility] 8/59/2924, 4:62 PM Certified in Neurology, Clinical Neurophysiology, Sleep Medicine, Pain Medicine and Neuroimaging  [facility] [address], [place], [place] [address] [phone]

## 2022-07-03 ENCOUNTER — Other Ambulatory Visit: Payer: Self-pay | Admitting: Endocrinology

## 2022-07-03 ENCOUNTER — Other Ambulatory Visit: Payer: BC Managed Care – PPO

## 2022-07-03 DIAGNOSIS — E890 Postprocedural hypothyroidism: Secondary | ICD-10-CM

## 2022-07-05 ENCOUNTER — Ambulatory Visit: Payer: BC Managed Care – PPO | Admitting: Endocrinology

## 2022-09-16 ENCOUNTER — Other Ambulatory Visit: Payer: Self-pay | Admitting: Endocrinology

## 2022-09-20 ENCOUNTER — Other Ambulatory Visit (INDEPENDENT_AMBULATORY_CARE_PROVIDER_SITE_OTHER): Payer: BC Managed Care – PPO

## 2022-09-20 DIAGNOSIS — E890 Postprocedural hypothyroidism: Secondary | ICD-10-CM

## 2022-09-20 LAB — TSH: TSH: 7.83 u[IU]/mL — ABNORMAL HIGH (ref 0.35–5.50)

## 2022-09-20 LAB — T4, FREE: Free T4: 0.84 ng/dL (ref 0.60–1.60)

## 2022-09-27 ENCOUNTER — Ambulatory Visit: Payer: BC Managed Care – PPO | Admitting: Endocrinology

## 2022-10-01 ENCOUNTER — Ambulatory Visit (INDEPENDENT_AMBULATORY_CARE_PROVIDER_SITE_OTHER): Payer: BC Managed Care – PPO | Admitting: Endocrinology

## 2022-10-01 ENCOUNTER — Encounter: Payer: Self-pay | Admitting: Endocrinology

## 2022-10-01 VITALS — BP 118/66 | HR 78 | Ht 64.5 in | Wt 192.0 lb

## 2022-10-01 DIAGNOSIS — E890 Postprocedural hypothyroidism: Secondary | ICD-10-CM

## 2022-10-01 MED ORDER — LEVOTHYROXINE SODIUM 112 MCG PO TABS: 224.0000 ug | ORAL_TABLET | Freq: Every day | ORAL | 3 refills | Status: AC

## 2022-10-01 NOTE — Progress Notes (Signed)
Patient ID: [name], female   DOB: August 14, 1976, 47 y.o.   MRN: [id number]   Reason for Appointment:  Hypothyroidism, followup visit   History of Present Illness:   HYPOTHYROIDISM was first diagnosed  after her I-131 treatment for Graves' disease in 2003 She had been on relatively large doses of thyroxine supplement up to 225 mcg and has been irregular with her followup   Since 09/2018 she has been prescribed 200 g of generic levothyroxine from [place]  She has been quite consistent with taking her levothyroxine daily before breakfast recently although she thinks that overall she has missed her dose or 2 randomly  She has not been seen for almost 2 years now She did not come for office visit in 2023 after her labs  She does have fatigue related to her chemotherapy Weight has gone up recently  She has been taking her thyroid supplement an hour before breakfast daily, not on calcium/iron supplements at the same time  TSH is still high at 7.8  Wt Readings from Last 3 Encounters:  10/01/22 192 lb (87.1 kg)  05/29/22 188 lb 12.8 oz (85.6 kg)  01/24/22 174 lb (78.9 kg)     Lab Results  Component Value Date   TSH 7.83 (H) 09/20/2022   TSH 31.29 (H) 10/01/2021   TSH 0.02 (L) 12/08/2020   FREET4 0.84 09/20/2022   FREET4 0.40 (L) 10/01/2021   FREET4 1.38 12/08/2020       Allergies as of 10/01/2022       Reactions   Tape    Vicodin [hydrocodone-acetaminophen] Nausea And Vomiting        Medication List        Accurate as of October 01, 2022  2:46 PM. If you have any questions, ask your nurse or doctor.          Eliquis 5 MG Tabs tablet Generic drug: apixaban 5 mg. TAKE 1 TABLET BY MOUTH TWICE DAILY.   levothyroxine 200 MCG tablet Commonly known as: SYNTHROID Take 1 tablet by mouth once daily        Allergies:  Allergies  Allergen Reactions   Tape    Vicodin [Hydrocodone-Acetaminophen] Nausea And Vomiting    Past Medical History:   Diagnosis Date   Breast cancer ([place])    H/O echocardiogram 02/20/2007, 06/03/2002   normal transthoracic echocardiogram   Hypothyroid    Varicose veins     Past Surgical History:  Procedure Laterality Date   mastectomy Left    2018   VEIN LIGATION AND STRIPPING      Family History  Problem Relation Age of Onset   Diabetes type I Father    Diabetes type II Sister    Hypertension Sister    Diabetes Sister    Kidney failure Mother    Kidney disease Maternal Grandmother    Diabetes Paternal Grandmother     Social History:  reports that she has never smoked. She has never used smokeless tobacco. She reports that she does not drink alcohol and does not use drugs.  REVIEW Of SYSTEMS:     Examination:   BP 118/66 (BP Location: Right Wrist, Patient Position: Sitting, Cuff Size: Small)   Pulse 78   Ht 5' 4.5" (1.638 m)   Wt 192 lb (87.1 kg)   SpO2 95%   BMI 32.45 kg/m      Assessment/Plan:   Hypothyroidism, post ablative.  She has been irregular with her follow-up Currently taking 200 mcg  levothyroxine daily and she thinks she has been regular with this before her labs were drawn She has a proper routine of taking this before breakfast  However she has fatigue related to her chemotherapy which is ongoing  She will now go up to 224 mcg instead of 200 mcg dosage once a day TSH to be checked again in 6 weeks More regular follow-up   Elayne Snare 10/01/2022, 2:46 PM

## 2022-10-01 NOTE — Patient Instructions (Signed)
New Rx

## 2023-01-28 ENCOUNTER — Ambulatory Visit (INDEPENDENT_AMBULATORY_CARE_PROVIDER_SITE_OTHER): Payer: BC Managed Care – PPO | Admitting: Neurology

## 2023-01-28 ENCOUNTER — Encounter: Payer: Self-pay | Admitting: Neurology

## 2023-01-28 VITALS — BP 101/70 | HR 93 | Ht 64.6 in | Wt 192.0 lb

## 2023-01-28 DIAGNOSIS — R260 Ataxic gait: Secondary | ICD-10-CM | POA: Diagnosis not present

## 2023-01-28 DIAGNOSIS — C50412 Malignant neoplasm of upper-outer quadrant of left female breast: Secondary | ICD-10-CM | POA: Diagnosis not present

## 2023-01-28 DIAGNOSIS — R3915 Urgency of urination: Secondary | ICD-10-CM | POA: Diagnosis not present

## 2023-01-28 DIAGNOSIS — G35 Multiple sclerosis: Secondary | ICD-10-CM

## 2023-01-28 NOTE — Progress Notes (Signed)
[facility]  PATIENT: [name] DOB: 07/12/1976  REFERRING DOCTOR OR PCP:  Joetta Manners SOURCE: patient, notes from PCP/endocrinology/hemonc, labs and imaging reports  _________________________________   HISTORICAL  CHIEF COMPLAINT:  Chief Complaint  Patient presents with   Follow-up    Rm 11. Alone. No new concerns, doing well.    HISTORY OF PRESENT ILLNESS:  [name] is a 47 y.o.o wman with multiple sclerosis. She also has breast cancer.   She had surgery recently and will be doing radiation.    Update 01/28/2023. Her MS is stable off any DMT and she denies any new neurologic symptom.   She has a low plaque burden -- 9-10 in the brain and 1-2 in the spine.    Currently, her gait and balance are doing well.   She goes downstairs witout using the bannister.  No falls.  She could easily walk 3 miles at a good clip. However, tandem gait is mildly off balanced.   She denies weakness, spasticity or numbness in the limbs..   She has urinary urgency/frequency though this seems to fluctuate.     She has never had optic neuritis or diplopia.     She has more fatigue since the breast cancer.  However, she is able to do any activity that she wants to. She has occasional nights with insomnia, her sleep maintenance worse than onset. . She sometimes takes melatonin.  She denies any difficulties with depression or anxiety. Cognition is fine.  After her radiation, she started on capecitabine now  MS History: She was diagnosed with MS in 2014 after presenting with vertigo and gait ataxia and being found to have an enhancing lesion in the spinal cord adjacent to C4 and other changes consistent with MS in the brain.  Details of history below.     She has not had any exacerbations since her presenting severe symptoms in 2014. She feels she has made a near complete recovery.  She was briefly on a pill in 2014 (Tecfidera) x 3-4 months but stopped with pregnancy.  She never  went back on a DMT.      We discussed different options for a DMT.   Due to the breast cancer, I would be reluctant to use one of the anti-CD20 agents, cladribine or any of the S1P receptor modulators.   Due to GI issues the fumarates might not be tolerated.    Aubagio might be a good choice balancing safety and efficacy, or Tysabri if the MS appears more active.      She had a TB test (negative) at work in March 2023.     CBC/D 10/29/2021 showed lymphocyte cout low at 0.6.    MS History She was diagnosed with MS in August 2014 after presenting with vertigo followed a few days later by gait ataxia and right sided numbness.  MRI of the cervical spine 04/26/2013 showed an enhancing lesion to the right at C4. MRI of the brain 05/06/2013 showed lesions consistent with MS including a  right frontal lobe lesion that enhanced. Foci were also noted in   middle cerebellar peduncles. Showed multiple    MRI 05/14/2013 at [facility] showed more changes c/w MS.   No LP was needed.   She did Rehab and I started to see her in October 2014.     She started Gilenya 06/2013 and stopped 12/2013 for pregnancy.   She delivered a premature baby (28 weeks).    Her baby has  done well.         Breast cancer She was diagnosed with breast cancer 06/2015 and she had chemotherapy (Hceptin, Taxotere, Carboplatin, Perjeta), mastectomy and reconstructive surgery (left).   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   Since her initial exacerbation in 2014, she has had no other exacerbation.    She is being maintained on Nerlyn.   Her Oncologist is Dr. Calton Golds ([facility] [place]  [phone]).   She had a cancer relapse 2022 and was placed on chemotherpy.  As a compication, she had colitis.  She will be getting axillary node surgery June 2023.   She was on Keytruda end of 2022 and Jan 2023 but due to GI issues it was stopped.         REVIEW OF  SYSTEMS: Constitutional: No fevers, chills, sweats, or change in appetite Eyes: No visual changes, double vision, eye pain Ear, nose and throat: No hearing loss, ear pain, nasal congestion, sore throat Cardiovascular: No chest pain, palpitations Respiratory:  No shortness of breath at rest or with exertion.   No wheezes GastrointestinaI: No nausea, vomiting, diarrhea, abdominal pain, fecal incontinence Genitourinary:  No dysuria, urinary retention or frequency.  No nocturia. Musculoskeletal:  No neck pain, back pain Integumentary: No rash, pruritus, skin lesions Neurological: as above Psychiatric: No depression at this time.  No anxiety Endocrine:  she had iodine ablation of her thyroid due to thyroiditis and hypothyroidism. Hematologic/Lymphatic:  No anemia, purpura, petechiae.  She had breast cancer  Allergic/Immunologic: No itchy/runny eyes, nasal congestion, recent allergic reactions, rashes  ALLERGIES: Allergies  Allergen Reactions   Tape    Vicodin [Hydrocodone-Acetaminophen] Nausea And Vomiting    HOME MEDICATIONS:  Current Outpatient Medications:    capecitabine (XELODA) 500 MG tablet, Take 10 tablets by mouth daily., Disp: , Rfl:    cholecalciferol (VITAMIN D3) 25 MCG (1000 UNIT) tablet, Take 1,000 Units by mouth daily., Disp: , Rfl:    ELIQUIS 5 MG TABS tablet, 5 mg. TAKE 1 TABLET BY MOUTH TWICE DAILY., Disp: , Rfl:    levothyroxine (SYNTHROID) 112 MCG tablet, Take 2 tablets (224 mcg total) by mouth daily., Disp: 180 tablet, Rfl: 3  PAST MEDICAL HISTORY: Past Medical History:  Diagnosis Date   Breast cancer ([facility])    H/O echocardiogram 02/20/2007, 06/03/2002   normal transthoracic echocardiogram   Hypothyroid    Varicose veins     PAST SURGICAL HISTORY: Past Surgical History:  Procedure Laterality Date   mastectomy Left    2018   VEIN LIGATION AND STRIPPING      FAMILY HISTORY: Family History  Problem Relation Age of Onset   Diabetes type I Father     Diabetes type II Sister    Hypertension Sister    Diabetes Sister    Kidney failure Mother    Kidney disease Maternal Grandmother    Diabetes Paternal Grandmother     SOCIAL HISTORY:  Social History   Socioeconomic History   Marital status: Married    Spouse name: Not on file   Number of children: 2   Years of education: Not on file   Highest education level: Not on file  Occupational History   Occupation: cna  Tobacco Use   Smoking status: Never   Smokeless tobacco: Never  Substance and Sexual Activity   Alcohol use: No   Drug use: No   Sexual activity: Not  on file  Other Topics Concern   Not on file  Social History Narrative   Not on file   Social Determinants of Health   Financial Resource Strain: Not on file  Food Insecurity: Not on file  Transportation Needs: Not on file  Physical Activity: Not on file  Stress: Not on file  Social Connections: Not on file  Intimate Partner Violence: Not on file     PHYSICAL EXAM  Vitals:   01/28/23 1450  BP: 101/70  Pulse: 93  Weight: 192 lb (87.1 kg)  Height: 5' 4.6" (1.641 m)    Body mass index is 32.35 kg/m.   General: The patient is well-developed and well-nourished and in no acute distress  Skin/ext: She has mild left arm lymphedema  Musculoskeletal:  Back is nontender  Neurologic Exam  Mental status: The patient is alert and oriented x 3 at the time of the examination. The patient has apparent normal recent and remote memory, with an apparently normal attention span and concentration ability.   Speech is normal.  Cranial nerves: Extraocular movements are full. Pupils are equal, round, and reactive to light and accomodation.  Color vision is symmetric.  Facial strength and sensation are normal   Hearing appeared symetric  Motor:  Muscle bulk is normal.   Tone is normal. Strength is  5 / 5 in all 4 extremities.   Sensory: Sensory testing is intact to pinprick, soft touch and vibration sensation in all 4  extremities.  Coordination: Cerebellar testing reveals good finger-nose-finger and heel-to-shin bilaterally.  Gait and station: Station is normal.   Gait is normal. Tandem gait is mildly wide. Romberg is negative.   Reflexes: Deep tendon reflexes are symmetric and normal in arms but spread at knees, and 2 beats clonus at left ankle.       DIAGNOSTIC DATA (LABS, IMAGING, TESTING) - I reviewed patient records, labs, notes, testing and imaging myself where available.  Lab Results  Component Value Date   WBC 3.1 (L) 01/24/2022   HGB 12.7 01/24/2022   HCT 37.1 01/24/2022   MCV 88 01/24/2022   PLT 270 01/24/2022      Component Value Date/Time   NA 142 05/12/2017 1445   K 3.7 05/12/2017 1445   CL 103 05/12/2017 1445   CO2 23 05/12/2017 1445   GLUCOSE 96 05/12/2017 1445   GLUCOSE 94 01/26/2016 1830   BUN 9 05/12/2017 1445   CREATININE 1.22 (H) 05/12/2017 1445   CALCIUM 10.2 05/12/2017 1445   PROT 7.0 01/24/2022 1005   ALBUMIN 4.2 01/24/2022 1005   AST 67 (H) 01/24/2022 1005   ALT 61 (H) 01/24/2022 1005   ALKPHOS 68 01/24/2022 1005   BILITOT 0.3 01/24/2022 1005   GFRNONAA 55 (L) 05/12/2017 1445   GFRAA 63 05/12/2017 1445   No results found for: "CHOL", "HDL", "LDLCALC", "LDLDIRECT", "TRIG", "CHOLHDL" No results found for: "HGBA1C" No results found for: "VITAMINB12" Lab Results  Component Value Date   TSH 7.83 (H) 09/20/2022       ASSESSMENT AND PLAN  Multiple sclerosis ([facility])  Malignant neoplasm of upper-outer quadrant of left female breast, unspecified estrogen receptor status ([facility])  Urinary urgency  Ataxic gait    Her MS is very stable off a DMT.  We will recheck a brain MRi in 2025 and consider a DMT if any recent activity.   If there is any progression I would want her to initiate a therapy, probably Aubagio unless the aggressiveness of progression seems higher in  which case we would consider Tysabri. Stay active and exercise as tolerated.   Return in 8  months or sooner if there are new or worsening neurologic symptoms.    Jaelynn Pozo A. Epimenio Foot, MD, [facility] 01/28/2023, 4:01 PM Certified in Neurology, Clinical Neurophysiology, Sleep Medicine, Pain Medicine and Neuroimaging  [facility] [address] [place], [place] [address] [phone]

## 2024-03-19 ENCOUNTER — Encounter: Payer: Self-pay | Admitting: Advanced Practice Midwife
